# Patient Record
Sex: Female | Born: 1950
Health system: Southern US, Community
[De-identification: ages and names within clinical notes are randomized; demographics above are authoritative.]

## PROBLEM LIST (undated history)

## (undated) DIAGNOSIS — H3320 Serous retinal detachment, unspecified eye: Secondary | ICD-10-CM

## (undated) HISTORY — DX: Serous retinal detachment, unspecified eye: H33.20

---
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
## 1988-03-25 HISTORY — PX: CHOLECYSTECTOMY: SHX55

## 1994-03-25 HISTORY — PX: RETINAL DETACHMENT SURGERY: SHX105

## 2013-07-22 LAB — TSH: TSH: 1.59 u[IU]/mL (ref 0.41–5.90)

## 2013-07-22 LAB — CBC AND DIFFERENTIAL
Hemoglobin: 14.7 g/dL (ref 12.0–16.0)
Platelets: 258 10*3/uL (ref 150–399)
WBC: 6.2 10*3/mL

## 2013-07-22 LAB — HEPATIC FUNCTION PANEL
ALK PHOS: 52 U/L (ref 25–125)
ALT: 20 U/L (ref 7–35)
AST: 20 U/L (ref 13–35)

## 2013-07-22 LAB — LIPID PANEL
CHOLESTEROL: 217 mg/dL — AB (ref 0–200)
HDL: 80 mg/dL — AB (ref 35–70)
LDL Cholesterol: 117 mg/dL
Triglycerides: 111 mg/dL (ref 40–160)

## 2013-07-22 LAB — BASIC METABOLIC PANEL
CREATININE: 0.8 mg/dL (ref 0.5–1.1)
Glucose: 86 mg/dL
Potassium: 4.2 mmol/L (ref 3.4–5.3)
Sodium: 138 mmol/L (ref 137–147)

## 2014-05-12 ENCOUNTER — Ambulatory Visit (INDEPENDENT_AMBULATORY_CARE_PROVIDER_SITE_OTHER): Payer: BLUE CROSS/BLUE SHIELD | Admitting: Family Medicine

## 2014-05-12 ENCOUNTER — Encounter: Payer: Self-pay | Admitting: Family Medicine

## 2014-05-12 VITALS — BP 141/98 | HR 96 | Ht 64.0 in | Wt 158.0 lb

## 2014-05-12 DIAGNOSIS — I10 Essential (primary) hypertension: Secondary | ICD-10-CM

## 2014-05-12 DIAGNOSIS — Z1211 Encounter for screening for malignant neoplasm of colon: Secondary | ICD-10-CM | POA: Diagnosis not present

## 2014-05-12 NOTE — Progress Notes (Signed)
Subjective:    Patient ID: Amanda Moyer, female    DOB: [birth year], 64 y.o.   MRN: [id number]  HPI  64 year old female with a history of hypertension been chronic nasal allergic rhinitis comes in today to establish care. She was previously being seen at [facility]. Her physician of 20 years died suddenly. She has been on valsartan HCT for quite some time and has done well with it. She denies any side effects. No chest pain or short of breath. Normally but pressure is very well controlled. She does have allergies to cats and does have animals in her home so does take Sudafed on a regular basis. She typically uses Claritin as well. And sometimes Flonase but not consistently. She also reports a history of a goiter but has had normal thyroid levels were quite some time. She says she's due for repeat blood work in May or June of this year. She will get her records transferred. Next  She reports that her Pap smear and mammogram are all up-to-date.   Review of Systems  Constitutional: Negative for fever, diaphoresis and unexpected weight change.  HENT: Negative for hearing loss, rhinorrhea and tinnitus.   Eyes: Negative for visual disturbance.  Respiratory: Negative for cough and wheezing.   Cardiovascular: Negative for chest pain and palpitations.  Gastrointestinal: Negative for nausea, vomiting, diarrhea and blood in stool.  Genitourinary: Negative for vaginal bleeding, vaginal discharge and difficulty urinating.  Musculoskeletal: Negative for myalgias and arthralgias.  Skin: Negative for rash.  Neurological: Negative for headaches.  Hematological: Negative for adenopathy. Does not bruise/bleed easily.  Psychiatric/Behavioral: Positive for sleep disturbance. Negative for dysphoric mood. The patient is not nervous/anxious.    BP 141/98 mmHg  Pulse 96  Ht  (1.626 m)  Wt 158 lb (71.668 kg)  BMI 27.11 kg/m2    No Known Allergies  Past Medical History  Diagnosis Date  . Retinal  detachment     both eyes     Past Surgical History  Procedure Laterality Date  . Cholecystectomy  1990  . Tonsillectomy  1960  . Arm fracture  1966  . Retinal detachment surgery Left 1996    History   Social History  . Marital Status: Married    Spouse Name: N/A  . Number of Children: N/A  . Years of Education: N/A   Occupational History  . Not on file.   Social History Main Topics  . Smoking status: Never Smoker   . Smokeless tobacco: Not on file  . Alcohol Use: 1.2 oz/week    2 Glasses of wine per week  . Drug Use: No  . Sexual Activity:    Partners: Male   Other Topics Concern  . Not on file   Social History Narrative   Exercises regularly by walking for 40-6- minutes 3 times a week. 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 She is married to [place] and has 2 adult children.       Family History  Problem Relation Age of Onset  . Prostate cancer Paternal Grandfather     prostate  . Depression Maternal Grandmother   . Hypertension Mother   . Hypertension Sister     Outpatient Encounter Prescriptions as of 05/12/2014  Medication Sig  . valsartan-hydrochlorothiazide (DIOVAN-HCT) 80-12.5 MG per tablet Take 1 tablet by mouth daily.          Objective:   Physical Exam  Constitutional: She is oriented to person, place, and time. She appears  well-developed and well-nourished.  HENT:  Head: Normocephalic and atraumatic.  Right Ear: External ear normal.  Left Ear: External ear normal.  Eyes: Conjunctivae are normal. Pupils are equal, round, and reactive to light.  Neck: Neck supple. No thyromegaly present.  Cardiovascular: Normal rate, regular rhythm and normal heart sounds.   No carotid bruits.   Pulmonary/Chest: Effort normal and breath sounds normal.  Musculoskeletal: She exhibits no edema.  NO LE edema.   Lymphadenopathy:    She has no cervical adenopathy.  Neurological: She is alert and oriented to person, place, and time.  Skin: Skin is warm  and dry.  Psychiatric: She has a normal mood and affect. Her behavior is normal.          Assessment & Plan:  HTN - not maximally controlled today. She will follow at home and call if still elevated.  F/U in 3 months for CPE and can recheck at that time.     Due for screening colonosocpy. Will refer to GI. Prefers a female provider.

## 2014-05-17 ENCOUNTER — Encounter: Payer: Self-pay | Admitting: Family Medicine

## 2014-05-17 DIAGNOSIS — M8580 Other specified disorders of bone density and structure, unspecified site: Secondary | ICD-10-CM | POA: Insufficient documentation

## 2014-05-18 ENCOUNTER — Encounter: Payer: Self-pay | Admitting: Family Medicine

## 2014-07-06 ENCOUNTER — Encounter: Payer: Self-pay | Admitting: Family Medicine

## 2014-07-06 DIAGNOSIS — J309 Allergic rhinitis, unspecified: Secondary | ICD-10-CM | POA: Insufficient documentation

## 2014-07-06 DIAGNOSIS — K219 Gastro-esophageal reflux disease without esophagitis: Secondary | ICD-10-CM | POA: Insufficient documentation

## 2014-07-06 DIAGNOSIS — J45909 Unspecified asthma, uncomplicated: Secondary | ICD-10-CM | POA: Insufficient documentation

## 2014-07-14 ENCOUNTER — Encounter: Payer: Self-pay | Admitting: Family Medicine

## 2014-09-12 ENCOUNTER — Ambulatory Visit (INDEPENDENT_AMBULATORY_CARE_PROVIDER_SITE_OTHER): Payer: BLUE CROSS/BLUE SHIELD | Admitting: Family Medicine

## 2014-09-12 ENCOUNTER — Encounter: Payer: Self-pay | Admitting: Family Medicine

## 2014-09-12 VITALS — BP 135/86 | HR 83 | Temp 98.4°F | Wt 147.0 lb

## 2014-09-12 DIAGNOSIS — I10 Essential (primary) hypertension: Secondary | ICD-10-CM

## 2014-09-12 DIAGNOSIS — R002 Palpitations: Secondary | ICD-10-CM

## 2014-09-12 DIAGNOSIS — F5231 Female orgasmic disorder: Secondary | ICD-10-CM

## 2014-09-12 DIAGNOSIS — Z1231 Encounter for screening mammogram for malignant neoplasm of breast: Secondary | ICD-10-CM | POA: Diagnosis not present

## 2014-09-12 DIAGNOSIS — R37 Sexual dysfunction, unspecified: Secondary | ICD-10-CM

## 2014-09-12 DIAGNOSIS — E049 Nontoxic goiter, unspecified: Secondary | ICD-10-CM | POA: Insufficient documentation

## 2014-09-12 DIAGNOSIS — IMO0002 Reserved for concepts with insufficient information to code with codable children: Secondary | ICD-10-CM

## 2014-09-12 NOTE — Progress Notes (Signed)
   Subjective:    Patient ID: Amanda Moyer, female    DOB: 1950/11/21, 64 y.o.   MRN: [id number]  HPI Hypertension- Pt denies chest pain, SOB, dizziness, or heart palpitations.  Taking meds as directed w/o problems.  Denies medication side effects.   Has lost 11 lbs.  She is doing fantastic.    Pain with orgasm. Will feel a little crampy and is breif.  Not during sex or penetration. Says she is postmenopuasal and says she wanted to know if OK.  No abnormal bleeding, etc. No vaginal dryness  occ feels breath "catch" in her chest or her heart skip.  Usually just a second.  Happens rarely. Not repetative or clusters. No known triggers.  No chest pain or SOB.   Review of Systems     Objective:   Physical Exam  Constitutional: She is oriented to person, place, and time. She appears well-developed and well-nourished.  HENT:  Head: Normocephalic and atraumatic.  Cardiovascular: Normal rate, regular rhythm and normal heart sounds.   Pulmonary/Chest: Effort normal and breath sounds normal.  Neurological: She is alert and oriented to person, place, and time.  Skin: Skin is warm and dry.  Psychiatric: She has a normal mood and affect. Her behavior is normal.          Assessment & Plan:  HTN - well controlled today but borderline.  Discussed options. She really wants to lose about 10 more lbs so can give a few more months to get BP even lower.  Don't want it to just be borderline.  Or could increase or adjust med today. She opted to work on losing next 10 lbs and going from there.    Pain with orgasm. Since not during intercourse may just be cramping of the uterus with orgasm.    Palpitations- rare, non distressing or disruptive. If happening more frequently to lasting longer then let me know. Likely beningn.     Due for screening colonoscopy and mammogram. Says she will schedule.Marland Kitchen

## 2014-12-14 ENCOUNTER — Other Ambulatory Visit: Payer: Self-pay | Admitting: Family Medicine

## 2014-12-22 LAB — ALBUMIN: ALBUMIN - PEBFLD: 5

## 2014-12-22 LAB — HEPATIC FUNCTION PANEL
ALT: 27 U/L (ref 7–35)
AST: 24 U/L (ref 13–35)
Alkaline Phosphatase: 58 U/L (ref 25–125)

## 2014-12-22 LAB — BASIC METABOLIC PANEL
Creatinine: 0.9 mg/dL (ref 0.5–1.1)
POTASSIUM: 3.9 mmol/L (ref 3.4–5.3)

## 2014-12-22 LAB — TISSUE TRANSGLUTAMINASE, IGA: Tissue Transglutaminase Ab, IgA: <2

## 2014-12-22 LAB — CBC AND DIFFERENTIAL
HEMOGLOBIN: 15.7 g/dL (ref 12.0–16.0)
PLATELETS: 320 10*3/uL (ref 150–399)
WBC: 7.1 10^3/mL

## 2014-12-22 LAB — COMPLETE METABOLIC PANEL WITH GFR
Calcium, Ser: 10.1
Chloride: 90 mmol/L

## 2014-12-22 LAB — TSH: TSH: 1.42 u[IU]/mL (ref 0.41–5.90)

## 2014-12-27 ENCOUNTER — Encounter: Payer: Self-pay | Admitting: Family Medicine

## 2014-12-27 ENCOUNTER — Ambulatory Visit (INDEPENDENT_AMBULATORY_CARE_PROVIDER_SITE_OTHER): Payer: BLUE CROSS/BLUE SHIELD | Admitting: Family Medicine

## 2014-12-27 VITALS — BP 138/99 | HR 76 | Wt 143.0 lb

## 2014-12-27 DIAGNOSIS — Z0000 Encounter for general adult medical examination without abnormal findings: Secondary | ICD-10-CM | POA: Diagnosis not present

## 2014-12-27 DIAGNOSIS — Z23 Encounter for immunization: Secondary | ICD-10-CM | POA: Diagnosis not present

## 2014-12-27 DIAGNOSIS — E049 Nontoxic goiter, unspecified: Secondary | ICD-10-CM | POA: Diagnosis not present

## 2014-12-27 DIAGNOSIS — M8580 Other specified disorders of bone density and structure, unspecified site: Secondary | ICD-10-CM | POA: Diagnosis not present

## 2014-12-27 DIAGNOSIS — I10 Essential (primary) hypertension: Secondary | ICD-10-CM | POA: Diagnosis not present

## 2014-12-27 DIAGNOSIS — Z1159 Encounter for screening for other viral diseases: Secondary | ICD-10-CM

## 2014-12-27 DIAGNOSIS — Z114 Encounter for screening for human immunodeficiency virus [HIV]: Secondary | ICD-10-CM

## 2014-12-27 MED ORDER — VALSARTAN-HYDROCHLOROTHIAZIDE 80-12.5 MG PO TABS: 1.0000 | ORAL_TABLET | Freq: Every day | ORAL | Status: DC

## 2014-12-27 NOTE — Patient Instructions (Signed)
Keep up a regular exercise program and make sure you are eating a healthy diet Try to eat 4 servings of dairy a day, or if you are lactose intolerant take a calcium with vitamin D daily.  Your vaccines are up to date.   

## 2014-12-27 NOTE — Progress Notes (Signed)
  Subjective:     Amanda Moyer is a 64 y.o. female and is here for a comprehensive physical exam. The patient reports no problems.  Social History   Social History  . Marital Status: Married    Spouse Name: Amanda Moyer  . Number of Children: 2  . Years of Education: BS Sociolo   Occupational History  . Homemaker    Social History Main Topics  . Smoking status: Never Smoker   . Smokeless tobacco: Not on file  . Alcohol Use: 1.2 oz/week    2 Glasses of wine per week  . Drug Use: No  . Sexual Activity:    Partners: Male   Other Topics Concern  . Not on file   Social History Narrative   Exercises regularly by walking for 40-6- minutes 3 times a week. 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 She is married to Amanda Moyer and has 2 adult children.      Health Maintenance  Topic Date Due  . Hepatitis C Screening  06/01/1950  . HIV Screening  08/15/1967  . COLONOSCOPY  08/14/2012  . ZOSTAVAX  08/14/2012  . INFLUENZA VACCINE  10/24/2014  . MAMMOGRAM  09/04/2015  . DEXA SCAN  09/04/2015  . PAP SMEAR  08/28/2016  . TETANUS/TDAP  06/01/2024    The following portions of the patient's history were reviewed and updated as appropriate: allergies, current medications, past family history, past medical history, past social history, past surgical history and problem list.  Review of Systems Pertinent items noted in HPI and remainder of comprehensive ROS otherwise negative.   Objective:    BP 138/99 mmHg  Pulse 76  Wt 143 lb (64.864 kg) General appearance: alert, cooperative and appears stated age Head: Normocephalic, without obvious abnormality, atraumatic Eyes: conj clera, EOMI, PEERLA Ears: normal TM's and external ear canals both ears Nose: Nares normal. Septum midline. Mucosa normal. No drainage or sinus tenderness. Throat: lips, mucosa, and tongue normal; teeth and gums normal Neck: no adenopathy, no carotid bruit, no JVD, supple, symmetrical, trachea midline and thyroid  not enlarged, symmetric, no tenderness/mass/nodules Back: symmetric, no curvature. ROM normal. No CVA tenderness. Lungs: clear to auscultation bilaterally Breasts: normal appearance, no masses or tenderness Heart: regular rate and rhythm, S1, S2 normal, no murmur, click, rub or gallop Abdomen: soft, non-tender; bowel sounds normal; no masses,  no organomegaly Extremities: extremities normal, atraumatic, no cyanosis or edema Pulses: 2+ and symmetric Skin: Skin color, texture, turgor normal. No rashes or lesions Lymph nodes: Cervical, supraclavicular, and axillary nodes normal. Neurologic: Alert and oriented X 3, normal strength and tone. Normal symmetric reflexes. Normal coordination and gait    Assessment:    Healthy female exam.      Plan:     See After Visit Summary for Counseling Recommendations   Keep up a regular exercise program and make sure you are eating a healthy diet Try to eat 4 servings of dairy a day, or if you are lactose intolerant take a calcium with vitamin D daily.  Your vaccines are up to date.  Scheduled for colonoscopy.  She plans to schedule her mammogram later this fall.   Hypertension- Pt denies chest pain, SOB, dizziness, or heart palpitations.  Taking meds as directed w/o problems.  Denies medication side effects.   Blood pressures well controlled continue current regimen. Follow-up in 6 months.

## 2015-01-03 ENCOUNTER — Encounter: Payer: Self-pay | Admitting: Family Medicine

## 2015-01-18 LAB — HM COLONOSCOPY

## 2015-01-31 LAB — HM MAMMOGRAPHY

## 2015-02-23 ENCOUNTER — Encounter: Payer: Self-pay | Admitting: Family Medicine

## 2015-03-17 ENCOUNTER — Other Ambulatory Visit: Payer: Self-pay | Admitting: Family Medicine

## 2015-06-27 ENCOUNTER — Ambulatory Visit: Payer: BLUE CROSS/BLUE SHIELD | Admitting: Family Medicine

## 2015-07-18 ENCOUNTER — Encounter: Payer: Self-pay | Admitting: Family Medicine

## 2015-07-18 ENCOUNTER — Ambulatory Visit (INDEPENDENT_AMBULATORY_CARE_PROVIDER_SITE_OTHER): Payer: BLUE CROSS/BLUE SHIELD | Admitting: Family Medicine

## 2015-07-18 VITALS — BP 134/86 | HR 80 | Wt 153.0 lb

## 2015-07-18 DIAGNOSIS — I10 Essential (primary) hypertension: Secondary | ICD-10-CM

## 2015-07-18 DIAGNOSIS — J4520 Mild intermittent asthma, uncomplicated: Secondary | ICD-10-CM

## 2015-07-18 DIAGNOSIS — E049 Nontoxic goiter, unspecified: Secondary | ICD-10-CM

## 2015-07-18 DIAGNOSIS — M8580 Other specified disorders of bone density and structure, unspecified site: Secondary | ICD-10-CM | POA: Diagnosis not present

## 2015-07-18 DIAGNOSIS — Z1159 Encounter for screening for other viral diseases: Secondary | ICD-10-CM

## 2015-07-18 DIAGNOSIS — Z114 Encounter for screening for human immunodeficiency virus [HIV]: Secondary | ICD-10-CM

## 2015-07-18 MED ORDER — ALBUTEROL SULFATE HFA 108 (90 BASE) MCG/ACT IN AERS: 2.0000 | INHALATION_SPRAY | Freq: Four times a day (QID) | RESPIRATORY_TRACT | Status: DC | PRN

## 2015-07-18 MED ORDER — VALSARTAN-HYDROCHLOROTHIAZIDE 80-12.5 MG PO TABS: 1.0000 | ORAL_TABLET | Freq: Every day | ORAL | Status: DC

## 2015-07-18 NOTE — Addendum Note (Signed)
Addended by: Deno EtienneBARKLEY, Nadeen Shipman L on: 07/18/2015 11:32 AM   Modules accepted: Orders

## 2015-07-18 NOTE — Progress Notes (Signed)
Subjective:    Patient ID: Amanda Moyer, female    DOB: 21-Apr-1950, 65 y.o.   MRN: [id number]  HPI Hypertension- Pt denies chest pain, SOB, dizziness, or heart palpitations.  Taking meds as directed w/o problems.  Denies medication side effects.    Asthma - [facility]. She wants to be willing to take over her albuterol inhaler prescription.  She only uses it about 2 weeks out of the year.  She needs a new rx. She has had to use it a couple of times this week. But normally uses it less than once a month. No exacerbations or ED visits for asthma in the last year.  Osteopenia-she would like to have a vitamin D level checked. Asked DEXA scan was June 2015.  Review of Systems  BP 134/86 mmHg  Pulse 80  Wt 153 lb (69.4 kg)  SpO2 98%    Allergies  Allergen Reactions  . Irbesartan-Hydrochlorothiazide     Other reaction(s): Other (See Comments) Elevated blood pressure.  Marland Kitchen. Percodan [Oxycodone-Aspirin] Nausea Only    Past Medical History  Diagnosis Date  . Retinal detachment     both eyes     Past Surgical History  Procedure Laterality Date  . Cholecystectomy  1990  . Tonsillectomy  1960  . Arm fracture  1966    no hardware  . Retinal detachment surgery Left 1996    Social History   Social History  . Marital Status: Married    Spouse Name: Benita GutterRussel  . Number of Children: 2  . Years of Education: BS Sociolo   Occupational History  . Homemaker    Social History Main Topics  . Smoking status: Never Smoker   . Smokeless tobacco: Not on file  . Alcohol Use: 1.2 oz/week    2 Glasses of wine per week  . Drug Use: No  . Sexual Activity:    Partners: Male   Other Topics Concern  . Not on file   Social History Narrative   Exercises regularly by walking for 40-6- minutes 3 times a week. 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 She is married to [place] and has 2 adult children.       Family History   Problem Relation Age of Onset  . Prostate cancer Paternal Grandfather   . Depression Maternal Grandmother   . Hypertension Mother   . Hypertension Sister   . Alzheimer's disease Mother   . Parkinson's disease Mother     Outpatient Encounter Prescriptions as of 07/18/2015  Medication Sig  . albuterol (PROAIR HFA) 108 (90 Base) MCG/ACT inhaler Inhale 2 puffs into the lungs every 6 (six) hours as needed. 2 puffs 4 times a day as needed for shortness of breath  . fluticasone (FLONASE) 50 MCG/ACT nasal spray Place 1 spray into both nostrils 2 (two) times daily.  Marland Kitchen. linaclotide (LINZESS) 290 MCG CAPS capsule Take 1 capsule by mouth as needed. 1 capsule by mouth as needed  . loratadine (CLARITIN) 10 MG tablet Take 10 mg by mouth daily.  . valsartan-hydrochlorothiazide (DIOVAN-HCT) 80-12.5 MG tablet Take 1 tablet by mouth daily.  . [DISCONTINUED] albuterol (PROAIR HFA) 108 (90 Base) MCG/ACT inhaler Take 2 puffs by mouth 4 (four) times daily as needed. 2 puffs 4 times a day as needed for shortness of breath  . [DISCONTINUED] valsartan-hydrochlorothiazide (DIOVAN-HCT) 80-12.5 MG tablet Take 1 tablet by mouth daily.   No facility-administered encounter  medications on file as of 07/18/2015.          Objective:   Physical Exam  Constitutional: She is oriented to person, place, and time. She appears well-developed and well-nourished.  HENT:  Head: Normocephalic and atraumatic.  Cardiovascular: Normal rate, regular rhythm and normal heart sounds.   Pulmonary/Chest: Effort normal and breath sounds normal.  Neurological: She is alert and oriented to person, place, and time.  Skin: Skin is warm and dry.  Psychiatric: She has a normal mood and affect. Her behavior is normal.       Assessment & Plan:  HTN - Well controlled. Continue current regimen. Follow up in 42mo.    Asthma-mild intermittent-just uses albuterol as needed. New prescription sent to the pharmacy.  Osteopenia-we'll check  vitamin D level. Is on current guidelines repeat bone density in 10 years. She is low risk.  Shingles vaccine discussed.

## 2015-07-19 LAB — VITAMIN D 25 HYDROXY (VIT D DEFICIENCY, FRACTURES): VIT D 25 HYDROXY: 29 ng/mL — AB (ref 30–100)

## 2015-07-19 LAB — LIPID PANEL
CHOL/HDL RATIO: 2.8 ratio (ref <=5.0)
Cholesterol: 219 mg/dL — ABNORMAL HIGH (ref 125–200)
HDL: 78 mg/dL (ref >=46)
LDL CALC: 112 mg/dL (ref <130)
Triglycerides: 147 mg/dL (ref <150)
VLDL: 29 mg/dL (ref <30)

## 2015-07-19 LAB — COMPLETE METABOLIC PANEL WITH GFR
ALBUMIN: 4.5 g/dL (ref 3.6–5.1)
ALK PHOS: 47 U/L (ref 33–130)
ALT: 16 U/L (ref 6–29)
AST: 17 U/L (ref 10–35)
BUN: 11 mg/dL (ref 7–25)
CO2: 28 mmol/L (ref 20–31)
Calcium: 9.7 mg/dL (ref 8.6–10.4)
Chloride: 99 mmol/L (ref 98–110)
Creat: 0.88 mg/dL (ref 0.50–0.99)
GFR, EST NON AFRICAN AMERICAN: 71 mL/min (ref >=60)
GFR, Est African American: 81 mL/min (ref >=60)
GLUCOSE: 78 mg/dL (ref 65–99)
Potassium: 3.8 mmol/L (ref 3.5–5.3)
SODIUM: 138 mmol/L (ref 135–146)
TOTAL PROTEIN: 7.4 g/dL (ref 6.1–8.1)
Total Bilirubin: 0.7 mg/dL (ref 0.2–1.2)

## 2015-07-19 LAB — HEPATITIS C ANTIBODY: HCV AB: NEGATIVE

## 2015-07-19 LAB — TSH: TSH: 2.34 mIU/L

## 2015-07-19 LAB — HIV ANTIBODY (ROUTINE TESTING W REFLEX): HIV: NONREACTIVE

## 2016-01-16 ENCOUNTER — Ambulatory Visit: Payer: BLUE CROSS/BLUE SHIELD | Admitting: Family Medicine

## 2016-02-01 LAB — HM MAMMOGRAPHY

## 2016-02-05 ENCOUNTER — Encounter: Payer: Self-pay | Admitting: Family Medicine

## 2016-02-27 LAB — HM MAMMOGRAPHY

## 2016-03-22 ENCOUNTER — Encounter: Payer: Self-pay | Admitting: Family Medicine

## 2016-05-02 ENCOUNTER — Other Ambulatory Visit: Payer: Self-pay | Admitting: Family Medicine

## 2016-05-07 ENCOUNTER — Other Ambulatory Visit: Payer: Self-pay | Admitting: Family Medicine

## 2016-06-03 ENCOUNTER — Telehealth: Payer: Self-pay | Admitting: Family Medicine

## 2016-06-03 NOTE — Telephone Encounter (Signed)
I called patient and left a message that she is due for a f/u on BP and labs with Dr.Metheney

## 2016-06-07 ENCOUNTER — Ambulatory Visit (INDEPENDENT_AMBULATORY_CARE_PROVIDER_SITE_OTHER): Payer: BLUE CROSS/BLUE SHIELD | Admitting: Family Medicine

## 2016-06-07 ENCOUNTER — Encounter: Payer: Self-pay | Admitting: Family Medicine

## 2016-06-07 VITALS — BP 144/92 | HR 102 | Temp 97.9°F | Ht 64.0 in | Wt 160.0 lb

## 2016-06-07 DIAGNOSIS — J208 Acute bronchitis due to other specified organisms: Secondary | ICD-10-CM | POA: Diagnosis not present

## 2016-06-07 DIAGNOSIS — J019 Acute sinusitis, unspecified: Secondary | ICD-10-CM | POA: Diagnosis not present

## 2016-06-07 MED ORDER — AZITHROMYCIN 250 MG PO TABS: ORAL_TABLET | ORAL | 0 refills | Status: AC

## 2016-06-07 NOTE — Progress Notes (Signed)
   Subjective:    Patient ID: Georjean ModeSuzanne Vanwagner, female    DOB: 08/06/1950, 66 y.o.   MRN: [id number]  HPI  66 yo female comes in today with persistent sinus and chest symptoms. She was seen at [facility] about 7 days ago and diagnosed with acute bronchitis. She was placed on Augmentin and prednisone. She says she really just hasn't felt tremendously better. She still has a productive cough. No chills or sweats but does feel like she's been having some low-grade fevers at night. She still feels like she has a lot of sinus congestion and drainage but no facial pain or pressure. She's mostly been using Mucinex in addition to the prescribed antibiotic and prednisone. She has not used her inhaler though she has heard some intermittent wheezing.   Review of Systems     Objective:   Physical Exam  Constitutional: She is oriented to person, place, and time. She appears well-developed and well-nourished.  HENT:  Head: Normocephalic and atraumatic.  Right Ear: External ear normal.  Left Ear: External ear normal.  Nose: Nose normal.  Mouth/Throat: Oropharynx is clear and moist.  TMs and canals are clear.   Eyes: Conjunctivae and EOM are normal. Pupils are equal, round, and reactive to light.  Neck: Neck supple. No thyromegaly present.  Cardiovascular: Normal rate, regular rhythm and normal heart sounds.   Pulmonary/Chest: Effort normal and breath sounds normal. She has no wheezes.  Lymphadenopathy:    She has no cervical adenopathy.  Neurological: She is alert and oriented to person, place, and time.  Skin: Skin is warm and dry.  Psychiatric: She has a normal mood and affect.        Assessment & Plan:  Acute sinusitis/bronchitis - complete the augment and prednisone.  Will give zpack to fill if not better while out of town. OK To continue Mucinex and if she has spring allergies she may want to go ahead and start her Zyrtec that she normally takes in the spring. Use inhaler PRN.

## 2016-08-06 ENCOUNTER — Encounter: Payer: BLUE CROSS/BLUE SHIELD | Admitting: Family Medicine

## 2016-08-09 ENCOUNTER — Other Ambulatory Visit: Payer: Self-pay | Admitting: Family Medicine

## 2016-08-28 ENCOUNTER — Encounter: Payer: BLUE CROSS/BLUE SHIELD | Admitting: Family Medicine

## 2016-09-03 ENCOUNTER — Encounter: Payer: BLUE CROSS/BLUE SHIELD | Admitting: Family Medicine

## 2016-09-18 ENCOUNTER — Other Ambulatory Visit (HOSPITAL_COMMUNITY)
Admission: RE | Admit: 2016-09-18 | Discharge: 2016-09-18 | Disposition: A | Discharge status: Home / Self Care | Payer: BLUE CROSS/BLUE SHIELD | Source: Ambulatory Visit | Attending: Family Medicine | Admitting: Family Medicine

## 2016-09-18 ENCOUNTER — Ambulatory Visit (INDEPENDENT_AMBULATORY_CARE_PROVIDER_SITE_OTHER): Payer: BLUE CROSS/BLUE SHIELD | Admitting: Family Medicine

## 2016-09-18 ENCOUNTER — Encounter: Payer: Self-pay | Admitting: Family Medicine

## 2016-09-18 VITALS — BP 148/99 | HR 81 | Ht 64.0 in | Wt 156.0 lb

## 2016-09-18 DIAGNOSIS — Z0000 Encounter for general adult medical examination without abnormal findings: Secondary | ICD-10-CM

## 2016-09-18 DIAGNOSIS — E559 Vitamin D deficiency, unspecified: Secondary | ICD-10-CM | POA: Diagnosis not present

## 2016-09-18 DIAGNOSIS — E049 Nontoxic goiter, unspecified: Secondary | ICD-10-CM

## 2016-09-18 DIAGNOSIS — I10 Essential (primary) hypertension: Secondary | ICD-10-CM | POA: Diagnosis not present

## 2016-09-18 DIAGNOSIS — Z01419 Encounter for gynecological examination (general) (routine) without abnormal findings: Secondary | ICD-10-CM

## 2016-09-18 DIAGNOSIS — Z13220 Encounter for screening for lipoid disorders: Secondary | ICD-10-CM

## 2016-09-18 DIAGNOSIS — N841 Polyp of cervix uteri: Secondary | ICD-10-CM

## 2016-09-18 LAB — HM PAP SMEAR: HM PAP: NEGATIVE

## 2016-09-18 MED ORDER — VALSARTAN-HYDROCHLOROTHIAZIDE 160-25 MG PO TABS: 1.0000 | ORAL_TABLET | Freq: Every day | ORAL | 1 refills | Status: DC

## 2016-09-18 NOTE — Progress Notes (Signed)
Subjective:     Amanda Moyer is a 66 y.o. female and is here for a comprehensive physical exam. The patient reports no problems.  Social History   Social History  . Marital status: Married    Spouse name: Benita GutterRussel  . Number of children: 2  . Years of education: BS Sociolo   Occupational History  . Homemaker    Social History Main Topics  . Smoking status: Never Smoker  . Smokeless tobacco: Never Used  . Alcohol use 1.2 oz/week    2 Glasses of wine per week  . Drug use: No  . Sexual activity: Yes    Partners: Male   Other Topics Concern  . Not on file   Social History Narrative   Exercises regularly by walking for 40-6- minutes 3 times a week. 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 She is married to [place] and has 2 adult children.      Health Maintenance  Topic Date Due  . COLONOSCOPY  08/14/2012  . PAP SMEAR  08/28/2016  . INFLUENZA VACCINE  10/23/2016  . MAMMOGRAM  02/26/2018  . DEXA SCAN  09/04/2023  . TETANUS/TDAP  06/01/2024  . Hepatitis C Screening  Completed  . HIV Screening  Completed    The following portions of the patient's history were reviewed and updated as appropriate: allergies, current medications, past family history, past medical history, past social history, past surgical history and problem list.  Review of Systems A comprehensive review of systems was negative.   Objective:    BP (!) 156/97   Pulse 81   Ht 5\' 4"  (1.626 m)   Wt 156 lb (70.8 kg)   BMI 26.78 kg/m  General appearance: alert and cooperative Head: Normocephalic, without obvious abnormality, atraumatic Eyes: conj clear, EOMI, PEERLA Ears: normal TM's and external ear canals both ears Nose: Nares normal. Septum midline. Mucosa normal. No drainage or sinus tenderness. Throat: lips, mucosa, and tongue normal; teeth and gums normal Neck: no adenopathy, no carotid bruit, no JVD, supple, symmetrical, trachea midline and thyroid not enlarged, symmetric, no  tenderness/mass/nodules Back: symmetric, no curvature. ROM normal. No CVA tenderness. Lungs: clear to auscultation bilaterally Breasts: normal appearance, no masses or tenderness Heart: regular rate and rhythm, S1, S2 normal, no murmur, click, rub or gallop Abdomen: soft, non-tender; bowel sounds normal; no masses,  no organomegaly Pelvic: cervix normal in appearance, external genitalia normal, no adnexal masses or tenderness, no cervical motion tenderness, rectovaginal septum normal, uterus normal size, shape, and consistency, vagina normal without discharge and small cervical polyp Extremities: extremities normal, atraumatic, no cyanosis or edema Pulses: 2+ and symmetric Skin: Skin color, texture, turgor normal. No rashes or lesions Lymph nodes: Cervical, supraclavicular, and axillary nodes normal. Neurologic: Alert and oriented X 3, normal strength and tone. Normal symmetric reflexes. Normal coordination and gait    Assessment:    Healthy female exam.      Plan:     See After Visit Summary for Counseling Recommendations   Keep up a regular exercise program and make sure you are eating a healthy diet Try to eat 4 servings of dairy a day, or if you are lactose intolerant take a calcium with vitamin D daily.  Your vaccines are up to date.  Due for labs.   Cervical polyp - Recommend referral to GYN for removal. It is small.  Hyperpretension-uncontrolled today. Will increase Diovan HCTZ. Follow-up in 1-2 weeks for nurse blood pressure check.

## 2016-09-19 LAB — COMPLETE METABOLIC PANEL WITH GFR
ALT: 20 U/L (ref 6–29)
AST: 21 U/L (ref 10–35)
Albumin: 4.7 g/dL (ref 3.6–5.1)
Alkaline Phosphatase: 49 U/L (ref 33–130)
BUN: 11 mg/dL (ref 7–25)
CHLORIDE: 98 mmol/L (ref 98–110)
CO2: 24 mmol/L (ref 20–31)
Calcium: 9.8 mg/dL (ref 8.6–10.4)
Creat: 0.84 mg/dL (ref 0.50–0.99)
GFR, Est African American: 85 mL/min (ref >=60)
GFR, Est Non African American: 74 mL/min (ref >=60)
GLUCOSE: 91 mg/dL (ref 65–99)
POTASSIUM: 3.7 mmol/L (ref 3.5–5.3)
SODIUM: 136 mmol/L (ref 135–146)
Total Bilirubin: 1.1 mg/dL (ref 0.2–1.2)
Total Protein: 7.4 g/dL (ref 6.1–8.1)

## 2016-09-19 LAB — LIPID PANEL W/REFLEX DIRECT LDL
CHOL/HDL RATIO: 2.9 ratio (ref <5.0)
CHOLESTEROL: 219 mg/dL — AB (ref <200)
HDL: 76 mg/dL (ref >50)
LDL-Cholesterol: 121 mg/dL — ABNORMAL HIGH
NON-HDL CHOLESTEROL (CALC): 143 mg/dL — AB (ref <130)
Triglycerides: 112 mg/dL (ref <150)

## 2016-09-19 LAB — TSH: TSH: 1.44 m[IU]/L

## 2016-09-19 LAB — VITAMIN D 25 HYDROXY (VIT D DEFICIENCY, FRACTURES): Vit D, 25-Hydroxy: 23 ng/mL — ABNORMAL LOW (ref 30–100)

## 2016-09-20 LAB — CYTOLOGY - PAP: HPV (WINDOPATH): NOT DETECTED

## 2016-09-20 NOTE — Progress Notes (Signed)
Call patient: Your Pap smear is normal and negative for HPV.

## 2016-10-01 ENCOUNTER — Ambulatory Visit (INDEPENDENT_AMBULATORY_CARE_PROVIDER_SITE_OTHER): Payer: BLUE CROSS/BLUE SHIELD | Admitting: Family Medicine

## 2016-10-01 VITALS — BP 125/87 | HR 76 | Wt 156.0 lb

## 2016-10-01 DIAGNOSIS — I10 Essential (primary) hypertension: Secondary | ICD-10-CM

## 2016-10-01 NOTE — Progress Notes (Signed)
Hypertension-blood pressure looks fantastic today. Continue current regimen. Keep regular scheduled follow-up. As far [facility] The [facility] was placed on not sure why she has not been contacted yet.

## 2016-10-01 NOTE — Progress Notes (Signed)
Pt is here for BP check. She denies any CP, SOB and is taking medication as directed with no problems. Pt advised to f/u for BP in December Also pt inquired about the referral that was placed for her regarding the polyp  .Amanda PacasBarkley, Ciaran Begay [place]

## 2016-10-02 NOTE — Progress Notes (Signed)
I walked pt down to the GYN office to schedule her appt.Amanda PacasBarkley, Amanda Aoun GrinnellLynetta

## 2016-10-03 ENCOUNTER — Encounter: Payer: Self-pay | Admitting: Family Medicine

## 2016-10-07 ENCOUNTER — Encounter: Payer: Self-pay | Admitting: *Deleted

## 2016-10-07 ENCOUNTER — Ambulatory Visit (INDEPENDENT_AMBULATORY_CARE_PROVIDER_SITE_OTHER): Payer: BLUE CROSS/BLUE SHIELD | Admitting: Obstetrics & Gynecology

## 2016-10-07 ENCOUNTER — Encounter: Payer: Self-pay | Admitting: Obstetrics & Gynecology

## 2016-10-07 VITALS — BP 139/78 | HR 82 | Resp 16 | Ht 64.5 in | Wt 157.0 lb

## 2016-10-07 DIAGNOSIS — N841 Polyp of cervix uteri: Secondary | ICD-10-CM

## 2016-10-07 NOTE — Progress Notes (Signed)
History:  66 y.o. G2P2 here today as a referral for removal of cervical polyp. Her primary care provider did a GYN exam with PAP and noted a cervical polyp with a flat base and referred pt for removal. Pt denies bleeding.   She denies other GYN problems.  The following portions of the patient's history were reviewed and updated as appropriate: allergies, current medications, past family history, past medical history, past social history, past surgical history and problem list.  Review of Systems:  Pertinent items are noted in HPI.   Objective:  Physical Exam Blood pressure 139/78, pulse 82, resp. rate 16, height 5' 4.5" (1.638 m), weight 157 lb (71.2 kg).  CONSTITUTIONAL: Well-developed, well-nourished female in no acute distress.  HENT:  Normocephalic, atraumatic EYES: Conjunctivae and EOM are normal. No scleral icterus.  NECK: Normal range of motion SKIN: Skin is warm and dry. No rash noted. Not diaphoretic.No pallor. NEUROLGIC: Alert and oriented to person, place, and time. Normal coordination.  Pelvic: Normal appearing external genitalia; normal appearing vaginal mucosa. Cervical polyp noted. Using the long kelly forceps the polyps was removed. Part of the base could not be grasped and silver nitrate was used to dissolve the remainder of the lesion.  Normal discharge.      Assessment & Plan:  Endocervical polyp- suspect benign  F/u surg path rec f/u in 1 year for annual. Pt would like to come here for her annual GYN exam Total face-to-face time with patient was 15 min.  Greater than 50% was spent in counseling and coordination of care with the patient. We discussed- new pt info.      Virginie Josten L. Harraway-Smith, M.D., Evern CoreFACOG

## 2016-10-14 ENCOUNTER — Telehealth: Payer: Self-pay | Admitting: *Deleted

## 2016-10-14 NOTE — Telephone Encounter (Signed)
-----   Message from Willodean Rosenthalarolyn Harraway-Smith, MD sent at 10/13/2016 10:55 AM EDT ----- Please call pt. Her surg path for polyp was neg.  Thx, clh-S

## 2016-10-25 ENCOUNTER — Encounter: Payer: Self-pay | Admitting: Family Medicine

## 2016-10-29 ENCOUNTER — Ambulatory Visit: Payer: BLUE CROSS/BLUE SHIELD | Admitting: Osteopathic Medicine

## 2016-10-29 DIAGNOSIS — Z0189 Encounter for other specified special examinations: Secondary | ICD-10-CM

## 2016-11-22 ENCOUNTER — Other Ambulatory Visit: Payer: Self-pay | Admitting: Family Medicine

## 2017-01-15 NOTE — Progress Notes (Signed)
   Subjective:    Patient ID: Amanda Moyer, female    DOB: [birth year], 66 y.o.   MRN: [id number]  HPI 66 year old female comes in today complaining of a cough for approximately 10 days that is nonproductive.  But she still starting to get pain in her chest when she coughs.  She has been taking Mucinex and NyQuil and Primatene.  Last night she developed a low-grade temperature of 99.6. She feels she is extremely fatigue and getting some worse.    Review of Systems     Objective:   Physical Exam  Constitutional: She is oriented to person, place, and time. She appears well-developed and well-nourished.  HENT:  Head: Normocephalic and atraumatic.  Right Ear: External ear normal.  Left Ear: External ear normal.  Nose: Nose normal.  Mouth/Throat: Oropharynx is clear and moist.  TMs and canals are clear.   Eyes: Pupils are equal, round, and reactive to light. Conjunctivae and EOM are normal.  Neck: Neck supple. No thyromegaly present.  Cardiovascular: Normal rate, regular rhythm and normal heart sounds.   Pulmonary/Chest: Effort normal and breath sounds normal. She has no wheezes.  Lymphadenopathy:    She has no cervical adenopathy.  Neurological: She is alert and oriented to person, place, and time.  Skin: Skin is warm and dry.  Psychiatric: She has a normal mood and affect.          Assessment & Plan:  Acute bronchitis -gave reassurance.  Call if not better in about 5 days.  Explained that the cough can actually last for several more weeks but as far as energy levels fever etc. that should all be better within about 5 days.  Continue symptomatic care.  Return if any more severe symptoms or high fever.  Lungs are clear gave reassurance no sign of secondary pneumonia.

## 2017-01-16 ENCOUNTER — Encounter: Payer: Self-pay | Admitting: Family Medicine

## 2017-01-16 ENCOUNTER — Ambulatory Visit (INDEPENDENT_AMBULATORY_CARE_PROVIDER_SITE_OTHER): Payer: BLUE CROSS/BLUE SHIELD | Admitting: Family Medicine

## 2017-01-16 VITALS — BP 130/84 | HR 74 | Temp 98.3°F | Ht 65.0 in | Wt 155.0 lb

## 2017-01-16 DIAGNOSIS — J209 Acute bronchitis, unspecified: Secondary | ICD-10-CM

## 2017-01-16 NOTE — Patient Instructions (Addendum)

## 2017-01-31 ENCOUNTER — Telehealth: Payer: Self-pay | Admitting: *Deleted

## 2017-01-31 MED ORDER — AZITHROMYCIN 250 MG PO TABS: ORAL_TABLET | ORAL | 0 refills | Status: AC

## 2017-01-31 NOTE — Telephone Encounter (Signed)
Patient was told to call back if she was not better in a week for an antibiotic. Patient called and is requesting an antibiotic be sent to her pharmacy

## 2017-01-31 NOTE — Telephone Encounter (Signed)
Patient notified

## 2017-01-31 NOTE — Telephone Encounter (Signed)
Prescription sent for azithromycin. If she is not better after 5 days then please come back in so that we can recheck her lungs.

## 2017-03-26 ENCOUNTER — Other Ambulatory Visit: Payer: Self-pay | Admitting: Family Medicine

## 2017-05-16 ENCOUNTER — Telehealth: Payer: Self-pay

## 2017-05-16 MED ORDER — OSELTAMIVIR PHOSPHATE 75 MG PO CAPS: 75.0000 mg | ORAL_CAPSULE | Freq: Every day | ORAL | 0 refills | Status: DC

## 2017-05-16 NOTE — Telephone Encounter (Signed)
Rosalita ChessmanSuzanne called and states grandson tested positive for the flu. He has been staying with her. She reports no symptoms.    CMP Latest Ref Rng & Units 09/18/2016 07/18/2015 12/22/2014  Glucose 65 - 99 mg/dL 91 78 -  BUN 7 - 25 mg/dL 11 11 -  Creatinine 1.610.50 - 0.99 mg/dL 0.960.84 0.450.88 0.9  Sodium 409135 - 146 mmol/L 136 138 -  Potassium 3.5 - 5.3 mmol/L 3.7 3.8 3.9  Chloride 98 - 110 mmol/L 98 99 90  CO2 20 - 31 mmol/L 24 28 -  Calcium 8.6 - 10.4 mg/dL 9.8 9.7 81.110.1  Total Protein 6.1 - 8.1 g/dL 7.4 7.4 -  Total Bilirubin 0.2 - 1.2 mg/dL 1.1 0.7 -  Alkaline Phos 33 - 130 U/L 49 47 58  AST 10 - 35 U/L 21 17 24   ALT 6 - 29 U/L 20 16 27

## 2017-05-16 NOTE — Telephone Encounter (Signed)
New rx sent for Tamiflu.

## 2017-05-19 NOTE — Telephone Encounter (Signed)
Left VM for Pt to make sure she got Rx, and to get an update on how she's feeling. Callback info provided.

## 2017-09-19 ENCOUNTER — Other Ambulatory Visit: Payer: Self-pay | Admitting: Family Medicine

## 2017-10-14 ENCOUNTER — Other Ambulatory Visit: Payer: Self-pay | Admitting: Family Medicine

## 2017-10-18 ENCOUNTER — Other Ambulatory Visit: Payer: Self-pay | Admitting: Family Medicine

## 2017-11-03 ENCOUNTER — Other Ambulatory Visit: Payer: Self-pay | Admitting: Family Medicine

## 2017-11-13 DIAGNOSIS — J0110 Acute frontal sinusitis, unspecified: Secondary | ICD-10-CM | POA: Diagnosis not present

## 2017-11-26 ENCOUNTER — Other Ambulatory Visit: Payer: Self-pay | Admitting: Family Medicine

## 2017-12-11 ENCOUNTER — Ambulatory Visit (INDEPENDENT_AMBULATORY_CARE_PROVIDER_SITE_OTHER): Payer: Medicare Other | Admitting: Family Medicine

## 2017-12-11 ENCOUNTER — Encounter: Payer: Self-pay | Admitting: Family Medicine

## 2017-12-11 VITALS — BP 130/84 | HR 85 | Ht 65.0 in | Wt 155.0 lb

## 2017-12-11 DIAGNOSIS — I10 Essential (primary) hypertension: Secondary | ICD-10-CM | POA: Diagnosis not present

## 2017-12-11 DIAGNOSIS — Z1231 Encounter for screening mammogram for malignant neoplasm of breast: Secondary | ICD-10-CM

## 2017-12-11 DIAGNOSIS — Z23 Encounter for immunization: Secondary | ICD-10-CM | POA: Diagnosis not present

## 2017-12-11 DIAGNOSIS — Z0000 Encounter for general adult medical examination without abnormal findings: Secondary | ICD-10-CM | POA: Diagnosis not present

## 2017-12-11 LAB — LIPID PANEL
CHOL/HDL RATIO: 2.8 (calc) (ref <5.0)
CHOLESTEROL: 212 mg/dL — AB (ref <200)
HDL: 75 mg/dL (ref >50)
LDL Cholesterol (Calc): 117 mg/dL (calc) — ABNORMAL HIGH
Non-HDL Cholesterol (Calc): 137 mg/dL (calc) — ABNORMAL HIGH (ref <130)
Triglycerides: 98 mg/dL (ref <150)

## 2017-12-11 LAB — COMPLETE METABOLIC PANEL WITHOUT GFR
AG Ratio: 2 (calc) (ref 1.0–2.5)
ALT: 20 U/L (ref 6–29)
AST: 19 U/L (ref 10–35)
Albumin: 4.7 g/dL (ref 3.6–5.1)
Alkaline phosphatase (APISO): 48 U/L (ref 33–130)
BUN: 12 mg/dL (ref 7–25)
CO2: 28 mmol/L (ref 20–32)
Calcium: 10 mg/dL (ref 8.6–10.4)
Chloride: 99 mmol/L (ref 98–110)
Creat: 0.91 mg/dL (ref 0.50–0.99)
GFR, Est African American: 77 mL/min/1.73m2
GFR, Est Non African American: 66 mL/min/1.73m2
Globulin: 2.3 g/dL (ref 1.9–3.7)
Glucose, Bld: 97 mg/dL (ref 65–99)
Potassium: 3.5 mmol/L (ref 3.5–5.3)
Sodium: 139 mmol/L (ref 135–146)
Total Bilirubin: 0.8 mg/dL (ref 0.2–1.2)
Total Protein: 7 g/dL (ref 6.1–8.1)

## 2017-12-11 LAB — CBC
HEMATOCRIT: 42.4 % (ref 35.0–45.0)
HEMOGLOBIN: 14.5 g/dL (ref 11.7–15.5)
MCH: 30.1 pg (ref 27.0–33.0)
MCHC: 34.2 g/dL (ref 32.0–36.0)
MCV: 88 fL (ref 80.0–100.0)
MPV: 10.7 fL (ref 7.5–12.5)
PLATELETS: 299 10*3/uL (ref 140–400)
RBC: 4.82 10*6/uL (ref 3.80–5.10)
RDW: 12.1 % (ref 11.0–15.0)
WBC: 6.2 10*3/uL (ref 3.8–10.8)

## 2017-12-11 MED ORDER — LINACLOTIDE 290 MCG PO CAPS: 290.0000 ug | ORAL_CAPSULE | Freq: Every day | ORAL | 5 refills | Status: DC

## 2017-12-11 NOTE — Progress Notes (Signed)
Subjective:    Amanda Moyer is a 67 y.o. female who presents for a Welcome to Medicare exam.   Review of Systems Comprehensive ROS is negative.   Cardiac Risk Factors include: advanced age (>82men, >19 women);hypertension      Objective:    Today's Vitals   12/11/17 0910  BP: 130/84  Pulse: 85  Weight: 155 lb (70.3 kg)  Height: 5\' 5"  (1.651 m)  Body mass index is 25.79 kg/m.  Medications Outpatient Encounter Medications as of 12/11/2017  Medication Sig  . albuterol (PROAIR HFA) 108 (90 Base) MCG/ACT inhaler Inhale 2 puffs into the lungs every 6 (six) hours as needed. 2 puffs 4 times a day as needed for shortness of breath  . BIOTIN PO Take by mouth.  . cetirizine (ZYRTEC) 10 MG tablet Take 10 mg by mouth as needed for allergies.  . cholecalciferol (VITAMIN D) 1000 units tablet Take 1,000 Units by mouth daily.  . DiphenhydrAMINE HCl (BENADRYL ALLERGY PO) Take by mouth as needed.  . fluticasone (FLONASE) 50 MCG/ACT nasal spray Place 1 spray into both nostrils 2 (two) times daily.  Marland Kitchen linaclotide (LINZESS) 290 MCG CAPS capsule Take 290 mcg by mouth.  . loratadine (CLARITIN) 10 MG tablet Take 10 mg by mouth daily.  Marland Kitchen MAGNESIUM PO Take by mouth.  . valsartan-hydrochlorothiazide (DIOVAN-HCT) 160-25 MG tablet Take 1 tablet by mouth daily. MUST MAKE APPOINTMENT FOR ANY FURTHER REFILLS  . [DISCONTINUED] oseltamivir (TAMIFLU) 75 MG capsule Take 1 capsule (75 mg total) by mouth daily.   No facility-administered encounter medications on file as of 12/11/2017.      History: Past Medical History:  Diagnosis Date  . Retinal detachment    both eyes    Past Surgical History:  Procedure Laterality Date  . arm fracture  1966   no hardware  . CHOLECYSTECTOMY  1990  . RETINAL DETACHMENT SURGERY Left 1996  . TONSILLECTOMY  1960    Family History  Problem Relation Age of Onset  . Prostate cancer Paternal Grandfather   . Depression Maternal Grandmother   . Hypertension Mother   .  Alzheimer's disease Mother   . Parkinson's disease Mother   . Hypertension Sister    Social History   Occupational History  . Occupation: Homemaker  Tobacco Use  . Smoking status: Never Smoker  . Smokeless tobacco: Never Used  Substance and Sexual Activity  . Alcohol use: Yes    Alcohol/week: 2.0 standard drinks    Types: 2 Glasses of wine per week  . Drug use: No  . Sexual activity: Yes    Partners: Male    Birth control/protection: None    Tobacco Counseling Counseling given: Not Answered   Immunizations and Health Maintenance Immunization History  Administered Date(s) Administered  . Influenza,inj,Quad PF,6+ Mos 11/23/2013, 12/27/2014, 05/02/2016, 12/11/2017  . PPD Test 06/01/2004  . Pneumococcal Conjugate-13 08/04/2017  . Tdap 06/02/2014  . Zoster Recombinat (Shingrix) 08/27/2017   There are no preventive care reminders to display for this patient.  Activities of Daily Living In your present state of health, do you have any difficulty performing the following activities: 12/11/2017  Hearing? N  Comment some ringing  Vision? N  Difficulty concentrating or making decisions? N  Walking or climbing stairs? N  Dressing or bathing? N  Doing errands, shopping? N  Preparing Food and eating ? N  Using the Toilet? N  In the past six months, have you accidently leaked urine? N  Do you have problems with  loss of bowel control? N  Managing your Medications? N  Managing your Finances? N  Housekeeping or managing your Housekeeping? N  Some recent data might be hidden    Physical Exam   Physical Exam  Constitutional: She is oriented to person, place, and time. She appears well-developed and well-nourished.  HENT:  Head: Normocephalic and atraumatic.  Right Ear: External ear normal.  Left Ear: External ear normal.  Nose: Nose normal.  Mouth/Throat: Oropharynx is clear and moist.  TMs and canals are clear.   Eyes: Pupils are equal, round, and reactive to light.  Conjunctivae and EOM are normal.  Neck: Neck supple. No thyromegaly present.  Cardiovascular: Normal rate, regular rhythm and normal heart sounds.  Pulmonary/Chest: Effort normal and breath sounds normal. She has no wheezes.  Lymphadenopathy:    She has no cervical adenopathy.  Neurological: She is alert and oriented to person, place, and time.  Skin: Skin is warm and dry.  Psychiatric: She has a normal mood and affect.     Advanced Directives: Does Patient Have a Medical Advance Directive?: Yes Type of Advance Directive: Healthcare Power of Attorney, Living will Does patient want to make changes to medical advance directive?: No - Patient declined Copy of Healthcare Power of Attorney in Chart?: No - copy requested    Assessment:    This is a routine wellness examination for this patient .  Vision/Hearing screen No exam data present  Dietary issues and exercise activities discussed:  Current Exercise Habits: Home exercise routine, Type of exercise: walking, Time (Minutes): 30, Frequency (Times/Week): 4, Weekly Exercise (Minutes/Week): 120, Intensity: Moderate  Goals   None    Depression Screen PHQ 2/9 Scores 12/11/2017 12/11/2017 01/16/2017  PHQ - 2 Score 1 0 0     Fall Risk Fall Risk  12/11/2017  Falls in the past year? No    Cognitive Function:     6CIT Screen 12/11/2017  What Year? 0 points  What month? 0 points  What time? 0 points  Count back from 20 0 points  Months in reverse 0 points  Repeat phrase 0 points  Total Score 0    Patient Care Team: Agapito GamesMetheney, Catherine D, MD as PCP - General (Family Medicine) Michel HarrowBarham, Kelly, MD as Referring Physician (Dermatology)     Plan:    Welcome to Medicare  I have personally reviewed and noted the following in the patient's chart:   . Medical and social history . Use of alcohol, tobacco or illicit drugs  . Current medications and supplements - updated.  . Functional ability and status . Nutritional  status . Physical activity - Good!  . Advanced directives . List of other physicians . Hospitalizations, surgeries, and ER visits in previous 12 months . Vitals . Screenings to include cognitive, depression, and falls . Referrals and appointments . Flu vaccine given today.  She is interested in getting her pneumonia vaccine updated but just does not want to do it on the same day so we will offer again at her next appointment. . She did get her first shingles vaccine but has not completed her second because she felt so poorly for several weeks after the first 1. . EKG today shows rate of 84 bpm, normal sinus rhythm.  Probable old Q waves in lead V2, V3 and V4.  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  Of an old EKG on file from 2013 that also says possible infarct. Unchanged from previous . Screening mammo ordered.    In addition,  I have reviewed and discussed with patient certain preventive protocols, quality metrics, and best practice recommendations. A written personalized care plan for preventive services as well as general preventive health recommendations were provided to patient.     Nani Gasser, MD 12/11/2017

## 2017-12-11 NOTE — Addendum Note (Signed)
Addended by: Nani GasserMETHENEY, Loyde Orth D on: 12/11/2017 12:30 PM   Modules accepted: Orders

## 2017-12-11 NOTE — Patient Instructions (Addendum)
Ms. Kalisz , Thank you for taking time to come for your Medicare Wellness Visit. I appreciate your ongoing commitment to your health goals. Please review the following plan we discussed and let me know if I can assist you in the future.   These are the goals we discussed: Goals   None     This is a list of the screening recommended for you and due dates:  Health Maintenance  Topic Date Due  . Pneumonia vaccines (1 of 2 - PCV13) 08/14/2017  . Flu Shot  10/23/2017  . Mammogram  02/26/2018  . Pap Smear  09/19/2019  . DEXA scan (bone density measurement)  09/04/2023  . Tetanus Vaccine  06/01/2024  . Colon Cancer Screening  01/17/2025  .  Hepatitis C: One time screening is recommended by Center for Disease Control  (CDC) for  adults born from [birth year] through 1963.   Completed  . HIV Screening  Completed    Health Maintenance, Female Adopting a healthy lifestyle and getting preventive care can go a long way to promote health and wellness. Talk with your health care provider about what schedule of regular examinations is right for you. This is a good chance for you to check in with your provider about disease prevention and staying healthy. In between checkups, there are plenty of things you can do on your own. Experts have done a lot of research about which lifestyle changes and preventive measures are most likely to keep you healthy. Ask your health care provider for more information. Weight and diet Eat a healthy diet  Be sure to include plenty of vegetables, fruits, low-fat dairy products, and lean protein.  Do not eat a lot of foods high in solid fats, added sugars, or salt.  Get regular exercise. This is one of the most important things you can do for your health. ? Most adults should exercise for at least 150 minutes each week. The exercise should increase your heart rate and make you sweat (moderate-intensity exercise). ? Most adults should also do strengthening exercises at least  twice a week. This is in addition to the moderate-intensity exercise.  Maintain a healthy weight  Body mass index (BMI) is a measurement that can be used to identify possible weight problems. It estimates body fat based on height and weight. Your health care provider can help determine your BMI and help you achieve or maintain a healthy weight.  For females 31 years of age and older: ? A BMI below 18.5 is considered underweight. ? A BMI of 18.5 to 24.9 is normal. ? A BMI of 25 to 29.9 is considered overweight. ? A BMI of 30 and above is considered obese.  Watch levels of cholesterol and blood lipids  You should start having your blood tested for lipids and cholesterol at 65 years of age, then have this test every 5 years.  You may need to have your cholesterol levels checked more often if: ? Your lipid or cholesterol levels are high. ? You are older than 65 years of age. ? You are at high risk for heart disease.  Cancer screening Lung Cancer  Lung cancer screening is recommended for adults 82-39 years old who are at high risk for lung cancer because of a history of smoking.  A yearly low-dose CT scan of the lungs is recommended for people who: ? Currently smoke. ? Have quit within the past 15 years. ? Have at least a 30-pack-year history of smoking. A pack year  is smoking an average of one pack of cigarettes a day for 1 year.  Yearly screening should continue until it has been 15 years since you quit.  Yearly screening should stop if you develop a health problem that would prevent you from having lung cancer treatment.  Breast Cancer  Practice breast self-awareness. This means understanding how your breasts normally appear and feel.  It also means doing regular breast self-exams. Let your health care provider know about any changes, no matter how small.  If you are in your 20s or 30s, you should have a clinical breast exam (CBE) by a health care provider every 1-3 years as  part of a regular health exam.  If you are 79 or older, have a CBE every year. Also consider having a breast X-ray (mammogram) every year.  If you have a family history of breast cancer, talk to your health care provider about genetic screening.  If you are at high risk for breast cancer, talk to your health care provider about having an MRI and a mammogram every year.  Breast cancer gene (BRCA) assessment is recommended for women who have family members with BRCA-related cancers. BRCA-related cancers include: ? Breast. ? Ovarian. ? Tubal. ? Peritoneal cancers.  Results of the assessment will determine the need for genetic counseling and BRCA1 and BRCA2 testing.  Cervical Cancer Your health care provider may recommend that you be screened regularly for cancer of the pelvic organs (ovaries, uterus, and vagina). This screening involves a pelvic examination, including checking for microscopic changes to the surface of your cervix (Pap test). You may be encouraged to have this screening done every 3 years, beginning at age 12.  For women ages 66-65, health care providers may recommend pelvic exams and Pap testing every 3 years, or they may recommend the Pap and pelvic exam, combined with testing for human papilloma virus (HPV), every 5 years. Some types of HPV increase your risk of cervical cancer. Testing for HPV may also be done on women of any age with unclear Pap test results.  Other health care providers may not recommend any screening for nonpregnant women who are considered low risk for pelvic cancer and who do not have symptoms. Ask your health care provider if a screening pelvic exam is right for you.  If you have had past treatment for cervical cancer or a condition that could lead to cancer, you need Pap tests and screening for cancer for at least 20 years after your treatment. If Pap tests have been discontinued, your risk factors (such as having a new sexual partner) need to be  reassessed to determine if screening should resume. Some women have medical problems that increase the chance of getting cervical cancer. In these cases, your health care provider may recommend more frequent screening and Pap tests.  Colorectal Cancer  This type of cancer can be detected and often prevented.  Routine colorectal cancer screening usually begins at 65 years of age and continues through 65 years of age.  Your health care provider may recommend screening at an earlier age if you have risk factors for colon cancer.  Your health care provider may also recommend using home test kits to check for hidden blood in the stool.  A small camera at the end of a tube can be used to examine your colon directly (sigmoidoscopy or colonoscopy). This is done to check for the earliest forms of colorectal cancer.  Routine screening usually begins at age 20.  Direct  examination of the colon should be repeated every 5-10 years through 65 years of age. However, you may need to be screened more often if early forms of precancerous polyps or small growths are found.  Skin Cancer  Check your skin from head to toe regularly.  Tell your health care provider about any new moles or changes in moles, especially if there is a change in a mole's shape or color.  Also tell your health care provider if you have a mole that is larger than the size of a pencil eraser.  Always use sunscreen. Apply sunscreen liberally and repeatedly throughout the day.  Protect yourself by wearing long sleeves, pants, a wide-brimmed hat, and sunglasses whenever you are outside.  Heart disease, diabetes, and high blood pressure  High blood pressure causes heart disease and increases the risk of stroke. High blood pressure is more likely to develop in: ? People who have blood pressure in the high end of the normal range (130-139/85-89 mm Hg). ? People who are overweight or obese. ? People who are African American.  If you  are 62-66 years of age, have your blood pressure checked every 3-5 years. If you are 29 years of age or older, have your blood pressure checked every year. You should have your blood pressure measured twice-once when you are at a hospital or clinic, and once when you are not at a hospital or clinic. Record the average of the two measurements. To check your blood pressure when you are not at a hospital or clinic, you can use: ? An automated blood pressure machine at a pharmacy. ? A home blood pressure monitor.  If you are between 72 years and 42 years old, ask your health care provider if you should take aspirin to prevent strokes.  Have regular diabetes screenings. This involves taking a blood sample to check your fasting blood sugar level. ? If you are at a normal weight and have a low risk for diabetes, have this test once every three years after 65 years of age. ? If you are overweight and have a high risk for diabetes, consider being tested at a younger age or more often. Preventing infection Hepatitis B  If you have a higher risk for hepatitis B, you should be screened for this virus. You are considered at high risk for hepatitis B if: ? You were born in a country where hepatitis B is common. Ask your health care provider which countries are considered high risk. ? Your parents were born in a high-risk country, and you have not been immunized against hepatitis B (hepatitis B vaccine). ? You have HIV or AIDS. ? You use needles to inject street drugs. ? You live with someone who has hepatitis B. ? You have had sex with someone who has hepatitis B. ? You get hemodialysis treatment. ? You take certain medicines for conditions, including cancer, organ transplantation, and autoimmune conditions.  Hepatitis C  Blood testing is recommended for: ? Everyone born from [birth year] through 1963. ? Anyone with known risk factors for hepatitis C.  Sexually transmitted infections (STIs)  You should be  screened for sexually transmitted infections (STIs) including gonorrhea and chlamydia if: ? You are sexually active and are younger than 65 years of age. ? You are older than 65 years of age and your health care provider tells you that you are at risk for this type of infection. ? Your sexual activity has changed since you were last screened and you  are at an increased risk for chlamydia or gonorrhea. Ask your health care provider if you are at risk.  If you do not have HIV, but are at risk, it may be recommended that you take a prescription medicine daily to prevent HIV infection. This is called pre-exposure prophylaxis (PrEP). You are considered at risk if: ? You are sexually active and do not regularly use condoms or know the HIV status of your partner(s). ? You take drugs by injection. ? You are sexually active with a partner who has HIV.  Talk with your health care provider about whether you are at high risk of being infected with HIV. If you choose to begin PrEP, you should first be tested for HIV. You should then be tested every 3 months for as long as you are taking PrEP. Pregnancy  If you are premenopausal and you may become pregnant, ask your health care provider about preconception counseling.  If you may become pregnant, take 400 to 800 micrograms (mcg) of folic acid every day.  If you want to prevent pregnancy, talk to your health care provider about birth control (contraception). Osteoporosis and menopause  Osteoporosis is a disease in which the bones lose minerals and strength with aging. This can result in serious bone fractures. Your risk for osteoporosis can be identified using a bone density scan.  If you are 45 years of age or older, or if you are at risk for osteoporosis and fractures, ask your health care provider if you should be screened.  Ask your health care provider whether you should take a calcium or vitamin D supplement to lower your risk for  osteoporosis.  Menopause may have certain physical symptoms and risks.  Hormone replacement therapy may reduce some of these symptoms and risks. Talk to your health care provider about whether hormone replacement therapy is right for you. Follow these instructions at home:  Schedule regular health, dental, and eye exams.  Stay current with your immunizations.  Do not use any tobacco products including cigarettes, chewing tobacco, or electronic cigarettes.  If you are pregnant, do not drink alcohol.  If you are breastfeeding, limit how much and how often you drink alcohol.  Limit alcohol intake to no more than 1 drink per day for nonpregnant women. One drink equals 12 ounces of beer, 5 ounces of wine, or 1 ounces of hard liquor.  Do not use street drugs.  Do not share needles.  Ask your health care provider for help if you need support or information about quitting drugs.  Tell your health care provider if you often feel depressed.  Tell your health care provider if you have ever been abused or do not feel safe at home. This information is not intended to replace advice given to you by your health care provider. Make sure you discuss any questions you have with your health care provider. Document Released: 09/24/2010 Document Revised: 08/17/2015 Document Reviewed: 12/13/2014 Elsevier Interactive Patient Education  [employer].

## 2017-12-24 ENCOUNTER — Ambulatory Visit: Payer: Medicare Other | Admitting: Physician Assistant

## 2018-01-22 ENCOUNTER — Telehealth: Payer: Self-pay

## 2018-01-22 NOTE — Telephone Encounter (Signed)
Pt left VM stating she recently had her Valsartan increased and she states that is is causing some issues with a cough and possibly some breathing issues.  I called pt back and go her VM, advised her that if she is having any issues with SOB, breathing complications, or swelling in her throat she need to seek immediate care. Asked pt call back and give more specific SX to see whether this may truly be due to increase in med or something else

## 2018-01-26 NOTE — Telephone Encounter (Signed)
Called pt again, she reports she did get message.  Pt states she thought she was having some allergy/asthma issues. Remembered a couple years ago she had a similar issue with other BP medications.  She reduced her dose on her own and SX resolved. Reports that she is now increasing her dose again to see if same runny nose/cough.asthma SX return. Pt will call back with update later in the week. Does not really want to change medications because she feels valsartan works well.   FYI to PCP, will update when I hear back from pt

## 2018-03-05 ENCOUNTER — Telehealth: Payer: Self-pay | Admitting: Family Medicine

## 2018-03-05 ENCOUNTER — Other Ambulatory Visit: Payer: Self-pay | Admitting: Family Medicine

## 2018-03-05 MED ORDER — VALSARTAN-HYDROCHLOROTHIAZIDE 160-25 MG PO TABS: 1.0000 | ORAL_TABLET | Freq: Every day | ORAL | 0 refills | Status: DC

## 2018-03-05 NOTE — Telephone Encounter (Signed)
Refilled was denied for Valsartan due to patient needed a follow up appointment. Sent her to schedule an appointment and will send over #15 tabs until appt date. KG LPN E scribe down will have doctor sign and will fax to [phone] [facility]

## 2018-03-24 ENCOUNTER — Encounter

## 2018-03-24 ENCOUNTER — Ambulatory Visit: Payer: Medicare Other | Admitting: Family Medicine

## 2018-04-03 DIAGNOSIS — Z1231 Encounter for screening mammogram for malignant neoplasm of breast: Secondary | ICD-10-CM | POA: Diagnosis not present

## 2018-04-03 LAB — HM MAMMOGRAPHY

## 2018-04-09 ENCOUNTER — Encounter: Payer: Self-pay | Admitting: Family Medicine

## 2018-04-21 ENCOUNTER — Ambulatory Visit: Payer: Medicare Other | Admitting: Family Medicine

## 2018-05-01 ENCOUNTER — Ambulatory Visit (INDEPENDENT_AMBULATORY_CARE_PROVIDER_SITE_OTHER): Payer: Medicare Other | Admitting: Family Medicine

## 2018-05-01 ENCOUNTER — Ambulatory Visit (INDEPENDENT_AMBULATORY_CARE_PROVIDER_SITE_OTHER): Payer: Medicare Other

## 2018-05-01 ENCOUNTER — Encounter: Payer: Self-pay | Admitting: Family Medicine

## 2018-05-01 VITALS — BP 135/83 | HR 90 | Ht 65.0 in | Wt 158.0 lb

## 2018-05-01 DIAGNOSIS — M2540 Effusion, unspecified joint: Secondary | ICD-10-CM | POA: Diagnosis not present

## 2018-05-01 DIAGNOSIS — M19041 Primary osteoarthritis, right hand: Secondary | ICD-10-CM

## 2018-05-01 DIAGNOSIS — R768 Other specified abnormal immunological findings in serum: Secondary | ICD-10-CM | POA: Diagnosis not present

## 2018-05-01 DIAGNOSIS — R7689 Other specified abnormal immunological findings in serum: Secondary | ICD-10-CM

## 2018-05-01 DIAGNOSIS — I10 Essential (primary) hypertension: Secondary | ICD-10-CM | POA: Diagnosis not present

## 2018-05-01 DIAGNOSIS — M19049 Primary osteoarthritis, unspecified hand: Secondary | ICD-10-CM | POA: Diagnosis not present

## 2018-05-01 DIAGNOSIS — E790 Hyperuricemia without signs of inflammatory arthritis and tophaceous disease: Secondary | ICD-10-CM | POA: Diagnosis not present

## 2018-05-01 DIAGNOSIS — M19042 Primary osteoarthritis, left hand: Secondary | ICD-10-CM | POA: Diagnosis not present

## 2018-05-01 MED ORDER — VALSARTAN-HYDROCHLOROTHIAZIDE 160-25 MG PO TABS: 1.0000 | ORAL_TABLET | Freq: Every day | ORAL | 1 refills | Status: DC

## 2018-05-01 NOTE — Progress Notes (Signed)
Subjective:    CC: BP  HPI:  Hypertension- Pt denies chest pain, SOB, dizziness, or heart palpitations.  Taking meds as directed w/o problems.  Denies medication side effects.    She also wants to discuss her joints in her hands.  He says she really does not have a lot of pain or stiffness but does notice over the last several years she started to get some deformity particularly in the DIP joints on her left hand as well as some chronic swelling and enlargement of the MCPs of the hands.  She says her mother and grandmother had some deformity of their hands as well.  She is not sure if it was rheumatoid or osteo-.  She does have a daughter with Graves' disease and another daughter with ray nodes.  BP 135/83   Pulse 90   Ht 5\' 5"  (1.651 m)   Wt 158 lb (71.7 kg)   SpO2 98%   BMI 26.29 kg/m     Allergies  Allergen Reactions  . Irbesartan-Hydrochlorothiazide     Other reaction(s): Other (See Comments) Elevated blood pressure.  Marland Kitchen Percodan [Oxycodone-Aspirin] Nausea Only    Past Medical History:  Diagnosis Date  . Retinal detachment    both eyes     Past Surgical History:  Procedure Laterality Date  . arm fracture  1966   no hardware  . CHOLECYSTECTOMY  1990  . RETINAL DETACHMENT SURGERY Left 1996  . TONSILLECTOMY  1960    Social History   Socioeconomic History  . Marital status: Married    Spouse name: [name]  . Number of children: 2  . Years of education: BS Sociolo  . Highest education level: Not on file  Occupational History  . Occupation: Futures trader  Social Needs  . Financial resource strain: Not on file  . Food insecurity:    Worry: Not on file    Inability: Not on file  . Transportation needs:    Medical: Not on file    Non-medical: Not on file  Tobacco Use  . Smoking status: Never Smoker  . Smokeless tobacco: Never Used  Substance and Sexual Activity  . Alcohol use: Yes    Alcohol/week: 2.0 standard drinks    Types: 2 Glasses of wine per week  . Drug  use: No  . Sexual activity: Yes    Partners: Male    Birth control/protection: None  Lifestyle  . Physical activity:    Days per week: Not on file    Minutes per session: Not on file  . Stress: Not on file  Relationships  . Social connections:    Talks on phone: Not on file    Gets together: Not on file    Attends religious service: Not on file    Active member of club or organization: Not on file    Attends meetings of clubs or organizations: Not on file    Relationship status: Not on file  . Intimate partner violence:    Fear of current or ex partner: Not on file    Emotionally abused: Not on file    Physically abused: Not on file    Forced sexual activity: Not on file  Other Topics Concern  . Not on file  Social History Narrative   Exercises regularly by walking for 40-6- minutes 3 times a week. 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 She is married to [place] and has 2 adult children.       Family History  Problem Relation Age of Onset  . Prostate cancer Paternal Grandfather   . Depression Maternal Grandmother   . Hypertension Mother   . Alzheimer's disease Mother   . Parkinson's disease Mother   . Hypertension Sister   . Graves' disease Daughter   . Raynaud syndrome Daughter     Outpatient Encounter Medications as of 05/01/2018  Medication Sig  . albuterol (PROAIR HFA) 108 (90 Base) MCG/ACT inhaler Inhale 2 puffs into the lungs every 6 (six) hours as needed. 2 puffs 4 times a day as needed for shortness of breath  . BIOTIN PO Take by mouth.  . cetirizine (ZYRTEC) 10 MG tablet Take 10 mg by mouth as needed for allergies.  . cholecalciferol (VITAMIN D) 1000 units tablet Take 1,000 Units by mouth daily.  . DiphenhydrAMINE HCl (BENADRYL ALLERGY PO) Take by mouth as needed.  . fluticasone (FLONASE) 50 MCG/ACT nasal spray Place 1 spray into both nostrils 2 (two) times daily.  Marland Kitchen linaclotide (LINZESS) 290 MCG CAPS capsule Take 1 capsule (290 mcg total) by mouth daily  before breakfast.  . loratadine (CLARITIN) 10 MG tablet Take 10 mg by mouth daily.  Marland Kitchen MAGNESIUM PO Take by mouth.  . valsartan-hydrochlorothiazide (DIOVAN-HCT) 160-25 MG tablet Take 1 tablet by mouth daily.  . [DISCONTINUED] valsartan-hydrochlorothiazide (DIOVAN-HCT) 160-25 MG tablet Take 1 tablet by mouth daily.   No facility-administered encounter medications on file as of 05/01/2018.        Objective:    General: Well Developed, well nourished, and in no acute distress.  Neuro: Alert and oriented x3, extra-ocular muscles intact, sensation grossly intact.  HEENT: Normocephalic, atraumatic  Skin: Warm and dry, no rashes. Cardiac: Regular rate and rhythm, no murmurs rubs or gallops, no lower extremity edema.  Respiratory: Clear to auscultation bilaterally. Not using accessory muscles, speaking in full sentences. MSK: She has some swelling and redness over the first MCPs on both hands.  As well as a little bit of swelling over the second MCPs on both hands.  She has some deformity at the DIP joints.  She has significant enlargement of the PIPs on several fingers on both hands.  No wrist redness or swelling or tenderness.  No joint tenderness on exam today.   Impression and Recommendations:    HTN - Well controlled. Continue current regimen. Follow up in  21months.    Hand arthritis.  New problem.  Recommend evaluate for rheumatoid arthritis based on her exam today though she really does not get a lot of stiffness and pain which is typically needed to help confirm the diagnoses.  We will do some additional lab work as well as x-rays for further work-up.

## 2018-05-05 LAB — BASIC METABOLIC PANEL WITH GFR
BUN: 10 mg/dL (ref 7–25)
CO2: 28 mmol/L (ref 20–32)
Calcium: 10.5 mg/dL — ABNORMAL HIGH (ref 8.6–10.4)
Chloride: 95 mmol/L — ABNORMAL LOW (ref 98–110)
Creat: 0.9 mg/dL (ref 0.50–0.99)
GFR, EST AFRICAN AMERICAN: 78 mL/min/{1.73_m2} (ref >=60)
GFR, Est Non African American: 67 mL/min/{1.73_m2} (ref >=60)
Glucose, Bld: 85 mg/dL (ref 65–99)
Potassium: 3.6 mmol/L (ref 3.5–5.3)
Sodium: 135 mmol/L (ref 135–146)

## 2018-05-05 LAB — CBC
HCT: 44.8 % (ref 35.0–45.0)
Hemoglobin: 15.1 g/dL (ref 11.7–15.5)
MCH: 29.7 pg (ref 27.0–33.0)
MCHC: 33.7 g/dL (ref 32.0–36.0)
MCV: 88.2 fL (ref 80.0–100.0)
MPV: 10.6 fL (ref 7.5–12.5)
PLATELETS: 336 10*3/uL (ref 140–400)
RBC: 5.08 10*6/uL (ref 3.80–5.10)
RDW: 11.8 % (ref 11.0–15.0)
WBC: 6.8 10*3/uL (ref 3.8–10.8)

## 2018-05-05 LAB — ANTI-NUCLEAR AB-TITER (ANA TITER): ANA Titer 1: 1:1280 {titer} — ABNORMAL HIGH

## 2018-05-05 LAB — CYCLIC CITRUL PEPTIDE ANTIBODY, IGG

## 2018-05-05 LAB — RHEUMATOID FACTOR: Rheumatoid fact SerPl-aCnc: 15 IU/mL — ABNORMAL HIGH (ref <14)

## 2018-05-05 LAB — SEDIMENTATION RATE: Sed Rate: 6 mm/h (ref 0–30)

## 2018-05-05 LAB — URIC ACID: Uric Acid, Serum: 8 mg/dL — ABNORMAL HIGH (ref 2.5–7.0)

## 2018-05-05 LAB — ANA: Anti Nuclear Antibody(ANA): POSITIVE — AB

## 2018-05-06 NOTE — Addendum Note (Signed)
Addended by: Mallie Snooks R on: 05/06/2018 02:12 PM   Modules accepted: Orders

## 2018-05-08 DIAGNOSIS — M19041 Primary osteoarthritis, right hand: Secondary | ICD-10-CM | POA: Diagnosis not present

## 2018-05-08 DIAGNOSIS — R768 Other specified abnormal immunological findings in serum: Secondary | ICD-10-CM | POA: Diagnosis not present

## 2018-05-08 DIAGNOSIS — E790 Hyperuricemia without signs of inflammatory arthritis and tophaceous disease: Secondary | ICD-10-CM | POA: Diagnosis not present

## 2018-05-08 DIAGNOSIS — M8589 Other specified disorders of bone density and structure, multiple sites: Secondary | ICD-10-CM | POA: Diagnosis not present

## 2018-05-08 DIAGNOSIS — M19042 Primary osteoarthritis, left hand: Secondary | ICD-10-CM | POA: Diagnosis not present

## 2018-05-14 DIAGNOSIS — M8588 Other specified disorders of bone density and structure, other site: Secondary | ICD-10-CM | POA: Diagnosis not present

## 2018-06-08 ENCOUNTER — Other Ambulatory Visit: Payer: Self-pay

## 2018-06-08 DIAGNOSIS — M8580 Other specified disorders of bone density and structure, unspecified site: Secondary | ICD-10-CM

## 2018-06-08 DIAGNOSIS — Z114 Encounter for screening for human immunodeficiency virus [HIV]: Secondary | ICD-10-CM

## 2018-06-08 DIAGNOSIS — I10 Essential (primary) hypertension: Secondary | ICD-10-CM

## 2018-06-08 DIAGNOSIS — E049 Nontoxic goiter, unspecified: Secondary | ICD-10-CM

## 2018-06-08 DIAGNOSIS — Z1159 Encounter for screening for other viral diseases: Secondary | ICD-10-CM

## 2018-06-08 MED ORDER — ALBUTEROL SULFATE HFA 108 (90 BASE) MCG/ACT IN AERS: 2.0000 | INHALATION_SPRAY | Freq: Four times a day (QID) | RESPIRATORY_TRACT | 1 refills | Status: DC | PRN

## 2018-10-30 ENCOUNTER — Ambulatory Visit: Payer: Medicare Other | Admitting: Family Medicine

## 2018-11-26 ENCOUNTER — Other Ambulatory Visit: Payer: Self-pay | Admitting: Family Medicine

## 2018-11-26 DIAGNOSIS — M19049 Primary osteoarthritis, unspecified hand: Secondary | ICD-10-CM

## 2018-11-26 DIAGNOSIS — I10 Essential (primary) hypertension: Secondary | ICD-10-CM

## 2019-03-04 ENCOUNTER — Encounter: Payer: Self-pay | Admitting: Family Medicine

## 2019-03-04 ENCOUNTER — Ambulatory Visit (INDEPENDENT_AMBULATORY_CARE_PROVIDER_SITE_OTHER): Payer: Medicare Other | Admitting: Family Medicine

## 2019-03-04 DIAGNOSIS — Z23 Encounter for immunization: Secondary | ICD-10-CM | POA: Diagnosis not present

## 2019-03-04 DIAGNOSIS — Z20828 Contact with and (suspected) exposure to other viral communicable diseases: Secondary | ICD-10-CM | POA: Diagnosis not present

## 2019-03-04 DIAGNOSIS — Z20822 Contact with and (suspected) exposure to covid-19: Secondary | ICD-10-CM

## 2019-03-04 NOTE — Progress Notes (Signed)
Agree with documentation as above.   Catherine Metheney, MD  

## 2019-03-04 NOTE — Progress Notes (Signed)
Patient seen for drive-up testing for Covid-19. She denies any symptoms. She has a grandchild that is due in the next two weeks and would like to make sure she does not have the virus prior to it's birth. Covid swab performed with no apparent issues. Patient will be contacted with results when available.

## 2019-03-05 LAB — NOVEL CORONAVIRUS, NAA: SARS-CoV-2, NAA: NOT DETECTED

## 2019-05-07 DIAGNOSIS — Z23 Encounter for immunization: Secondary | ICD-10-CM | POA: Diagnosis not present

## 2019-06-05 DIAGNOSIS — Z23 Encounter for immunization: Secondary | ICD-10-CM | POA: Diagnosis not present

## 2019-06-07 ENCOUNTER — Ambulatory Visit (INDEPENDENT_AMBULATORY_CARE_PROVIDER_SITE_OTHER): Payer: Medicare Other | Admitting: Family Medicine

## 2019-06-07 ENCOUNTER — Encounter: Payer: Self-pay | Admitting: Family Medicine

## 2019-06-07 ENCOUNTER — Other Ambulatory Visit: Payer: Self-pay

## 2019-06-07 DIAGNOSIS — T783XXA Angioneurotic edema, initial encounter: Secondary | ICD-10-CM

## 2019-06-07 MED ORDER — PREDNISONE 20 MG PO TABS: 20.0000 mg | ORAL_TABLET | Freq: Two times a day (BID) | ORAL | 0 refills | Status: AC

## 2019-06-07 NOTE — Patient Instructions (Signed)
Angioedema Angioedema is the sudden swelling of tissue in the body. Angioedema can affect any part of the body, but it most often affects the deeper parts of the skin, causing red, itchy patches (hives) to appear over the affected area. It often begins during the night and is found in the morning. Depending on the cause, angioedema may happen:  Only once.  Several times. It may come back in unpredictable patterns.  Repeatedly for several years. Over time, it may gradually stop coming back. Angioedema can be life-threatening if it affects the air passages that you breathe through. What are the causes? This condition may be caused by:  Foods, such as milk, eggs, shellfish, wheat, or nuts.  Certain medicines, such as ACE inhibitors, antibiotics, nonsteroidal anti-inflammatory drugs, birth control pills, or dyes used in X-rays.  Insect stings.  Infections. Angioedema can be inherited, and episodes can be triggered by:  Mild injury.  Dental work.  Surgery.  Stress.  Sudden changes in temperature.  Exercise. In some cases, the cause of this condition is not known. What are the signs or symptoms? Symptoms of this condition depend on where the swelling happens. Symptoms may include:  Swollen skin.  Red, itchy patches of skin (hives).  Redness in the affected area.  Pain in the affected area.  Swollen lips or tongue.  Wheezing.  Breathing problems.  If your internal organs are involved, symptoms may also include:  Nausea.  Abdominal pain.  Vomiting.  Difficulty swallowing.  Difficulty passing urine. How is this diagnosed? This condition may be diagnosed based on:  An exam of the affected area.  Your medical history.  Whether anyone in your family has had this condition before.  A review of any medicines you have been taking.  Tests, including: ? Allergy skin tests to see if the condition was caused by an allergic reaction. ? Blood tests to see if the  condition was caused by a gene. ? Tests to check for underlying diseases that could cause the condition. How is this treated? Treatment for this condition depends on the cause. It may involve any of the following:  If something triggered the condition, making changes to keep it from triggering the condition again.  If the condition affects your breathing, having tubes placed in your airway to keep it open.  Taking medicines to treat symptoms or prevent future episodes. These may include: ? Antihistamines. ? Epinephrine injections. ? Steroids. If your condition is severe, you may need to be treated at the hospital. Angioedema usually gets better in 24-48 hours. Follow these instructions at home:  Take over-the-counter and prescription medicines only as told by your health care provider.  If you were given medicines for emergency allergy treatment, always carry them with you.  Wear a medical bracelet as told by your health care provider.  If something triggers your condition, avoid the trigger, if possible.  If your condition is inherited and you are thinking about having children, talk to your health care provider. It is important to discuss the risks of passing on the condition to your children. Contact a health care provider if:  You have repeated episodes of angioedema.  Episodes of angioedema start to happen more often than they used to, even after you take steps to prevent them.  You have episodes of angioedema that are more severe than they have been before, even after you take steps to prevent them.  You are thinking about having children. Get help right away if:  You   have severe swelling of your mouth, tongue, or lips.  You have trouble breathing.  You have trouble swallowing.  You faint. This information is not intended to replace advice given to you by your health care provider. Make sure you discuss any questions you have with your health care provider. Document  Revised: 02/21/2017 Document Reviewed: 09/19/2015 Elsevier Patient Education  2020 Elsevier Inc.  

## 2019-06-07 NOTE — Assessment & Plan Note (Addendum)
Lips swollen today however without any additional symptoms.  No signs of airway compromise on exam.  She may continue benadryl every 4-6 hours as needed.  We discussed adding steroids.  Discussed that this may diminish her immune response to recent vaccine.  She would like to hold off on starting steroids to see if this improves on its own.  I let her know that I will go ahead and send in prednisone 20mg  BID x5 days, for her to have on hand in case this does not start to improve.  She is given instructions to seek emergency care if she develops wheezing, shortness of breath, dizziness/lightheadedness.  She express understanding

## 2019-06-07 NOTE — Progress Notes (Signed)
Amanda Moyer - 69 y.o. female MRN [id number]  Date of birth: 01/04/1951  Subjective Chief Complaint  Patient presents with  . Allergic Reaction    HPI Amanda Moyer is a 69 y.o. female here today with c/o possible allergic reaction.  She reports that she received her 2nd vaccine for COVID-19 on Saturday.  She noticed that lips were tingly and swollen last night and more swollen this morning.  She took benadryl with some improvement in itching.  She is not having any tongue swelling, shortness of breath, difficulty swallowing, dizziness, weakness.   She has never had reactions to vaccines in the past.   ROS:  A comprehensive ROS was completed and negative except as noted per HPI  Allergies  Allergen Reactions  . Irbesartan-Hydrochlorothiazide     Other reaction(s): Other (See Comments) Elevated blood pressure.  Marland Kitchen Percodan [Oxycodone-Aspirin] Nausea Only    Past Medical History:  Diagnosis Date  . Retinal detachment    both eyes     Past Surgical History:  Procedure Laterality Date  . arm fracture  1966   no hardware  . CHOLECYSTECTOMY  1990  . RETINAL DETACHMENT SURGERY Left 1996  . TONSILLECTOMY  1960    Social History   Socioeconomic History  . Marital status: Married    Spouse name: [name]  . Number of children: 2  . Years of education: BS Sociolo  . Highest education level: Not on file  Occupational History  . Occupation: Homemaker  Tobacco Use  . Smoking status: Never Smoker  . Smokeless tobacco: Never Used  Substance and Sexual Activity  . Alcohol use: Yes    Alcohol/week: 2.0 standard drinks    Types: 2 Glasses of wine per week  . Drug use: No  . Sexual activity: Yes    Partners: Male    Birth control/protection: None  Other Topics Concern  . Not on file  Social History Narrative   Exercises regularly by walking for 40-6- minutes 3 times a week. 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 She is married to [place] and has 2 adult children.       Social Determinants of Health   Financial Resource Strain:   . Difficulty of Paying Living Expenses:   Food Insecurity:   . Worried About Programme researcher, broadcasting/film/video in the Last Year:   . Barista in the Last Year:   Transportation Needs:   . Freight forwarder (Medical):   Marland Kitchen Lack of Transportation (Non-Medical):   Physical Activity:   . Days of Exercise per Week:   . Minutes of Exercise per Session:   Stress:   . Feeling of Stress :   Social Connections:   . Frequency of Communication with Friends and Family:   . Frequency of Social Gatherings with Friends and Family:   . Attends Religious Services:   . Active Member of Clubs or Organizations:   . Attends Banker Meetings:   Marland Kitchen Marital Status:     Family History  Problem Relation Age of Onset  . Prostate cancer Paternal Grandfather   . Depression Maternal Grandmother   . Hypertension Mother   . Alzheimer's disease Mother   . Parkinson's disease Mother   . Hypertension Sister   . Graves' disease Daughter   . Raynaud syndrome Daughter     Health Maintenance  Topic Date Due  . PNA vac Low Risk Adult (2 of 2 - PPSV23) 08/05/2018  . INFLUENZA VACCINE  10/24/2018  . MAMMOGRAM  04/03/2020  . DEXA SCAN  09/04/2023  . TETANUS/TDAP  06/01/2024  . COLONOSCOPY  01/17/2025  . Hepatitis C Screening  Completed     ----------------------------------------------------------------------------------------------------------------------------------------------------------------------------------------------------------------- Physical Exam BP 136/81   Pulse 83   Temp 98.2 F (36.8 C) (Oral)   Wt 161 lb (73 kg)   SpO2 99% Comment: on RA  BMI 26.79 kg/m   Physical Exam Constitutional:      Appearance: Normal appearance.  HENT:     Head: Normocephalic and atraumatic.     Mouth/Throat:     Mouth: Mucous membranes are moist.     Comments: Lips swollen.  No tongue swelling.  Eyes:     General: No scleral  icterus. Cardiovascular:     Rate and Rhythm: Normal rate and regular rhythm.  Pulmonary:     Effort: Pulmonary effort is normal.     Breath sounds: Normal breath sounds.  Skin:    General: Skin is warm and dry.  Neurological:     General: No focal deficit present.     Mental Status: She is alert.  Psychiatric:        Mood and Affect: Mood normal.        Behavior: Behavior normal.     ------------------------------------------------------------------------------------------------------------------------------------------------------------------------------------------------------------------- Assessment and Plan  Angioedema Lips swollen today however without any additional symptoms.  No signs of airway compromise on exam.  She may continue benadryl every 4-6 hours as needed.  We discussed adding steroids.  Discussed that this may diminish her immune response to recent vaccine.  She would like to hold off on starting steroids to see if this improves on its own.  I let her know that I will go ahead and send in prednisone 20mg  BID x5 days, for her to have on hand in case this does not start to improve.  She is given instructions to seek emergency care if she develops wheezing, shortness of breath, dizziness/lightheadedness.  She express understanding   Meds ordered this encounter  Medications  . predniSONE (DELTASONE) 20 MG tablet    Sig: Take 1 tablet (20 mg total) by mouth 2 (two) times daily with a meal for 5 days.    Dispense:  10 tablet    Refill:  0    No follow-ups on file.    This visit occurred during the SARS-CoV-2 public health emergency.  Safety protocols were in place, including screening questions prior to the visit, additional usage of staff PPE, and extensive cleaning of exam room while observing appropriate contact time as indicated for disinfecting solutions.

## 2019-06-19 ENCOUNTER — Other Ambulatory Visit: Payer: Self-pay | Admitting: Family Medicine

## 2019-06-19 DIAGNOSIS — M19049 Primary osteoarthritis, unspecified hand: Secondary | ICD-10-CM

## 2019-06-19 DIAGNOSIS — I10 Essential (primary) hypertension: Secondary | ICD-10-CM

## 2019-07-05 ENCOUNTER — Other Ambulatory Visit: Payer: Self-pay | Admitting: Family Medicine

## 2019-07-05 DIAGNOSIS — I10 Essential (primary) hypertension: Secondary | ICD-10-CM

## 2019-07-05 DIAGNOSIS — M19049 Primary osteoarthritis, unspecified hand: Secondary | ICD-10-CM

## 2019-07-16 DIAGNOSIS — Z91048 Other nonmedicinal substance allergy status: Secondary | ICD-10-CM | POA: Diagnosis not present

## 2019-07-16 DIAGNOSIS — W010XXA Fall on same level from slipping, tripping and stumbling without subsequent striking against object, initial encounter: Secondary | ICD-10-CM | POA: Diagnosis not present

## 2019-07-16 DIAGNOSIS — I10 Essential (primary) hypertension: Secondary | ICD-10-CM | POA: Diagnosis not present

## 2019-07-16 DIAGNOSIS — S52591A Other fractures of lower end of right radius, initial encounter for closed fracture: Secondary | ICD-10-CM | POA: Diagnosis not present

## 2019-07-16 DIAGNOSIS — S52501A Unspecified fracture of the lower end of right radius, initial encounter for closed fracture: Secondary | ICD-10-CM | POA: Diagnosis not present

## 2019-07-16 DIAGNOSIS — S62101A Fracture of unspecified carpal bone, right wrist, initial encounter for closed fracture: Secondary | ICD-10-CM | POA: Diagnosis not present

## 2019-07-16 DIAGNOSIS — S6291XA Unspecified fracture of right wrist and hand, initial encounter for closed fracture: Secondary | ICD-10-CM | POA: Diagnosis not present

## 2019-07-16 DIAGNOSIS — Y9301 Activity, walking, marching and hiking: Secondary | ICD-10-CM | POA: Diagnosis not present

## 2019-07-17 DIAGNOSIS — S52591A Other fractures of lower end of right radius, initial encounter for closed fracture: Secondary | ICD-10-CM | POA: Diagnosis not present

## 2019-07-20 DIAGNOSIS — S62101A Fracture of unspecified carpal bone, right wrist, initial encounter for closed fracture: Secondary | ICD-10-CM | POA: Diagnosis not present

## 2019-07-20 DIAGNOSIS — Z1159 Encounter for screening for other viral diseases: Secondary | ICD-10-CM | POA: Diagnosis not present

## 2019-07-23 DIAGNOSIS — S52571A Other intraarticular fracture of lower end of right radius, initial encounter for closed fracture: Secondary | ICD-10-CM | POA: Diagnosis not present

## 2019-07-23 DIAGNOSIS — G8918 Other acute postprocedural pain: Secondary | ICD-10-CM | POA: Diagnosis not present

## 2019-07-23 DIAGNOSIS — M1990 Unspecified osteoarthritis, unspecified site: Secondary | ICD-10-CM | POA: Diagnosis not present

## 2019-07-23 DIAGNOSIS — I10 Essential (primary) hypertension: Secondary | ICD-10-CM | POA: Diagnosis not present

## 2019-07-23 DIAGNOSIS — S52501A Unspecified fracture of the lower end of right radius, initial encounter for closed fracture: Secondary | ICD-10-CM | POA: Diagnosis not present

## 2019-08-06 DIAGNOSIS — S62101A Fracture of unspecified carpal bone, right wrist, initial encounter for closed fracture: Secondary | ICD-10-CM | POA: Diagnosis not present

## 2019-08-10 ENCOUNTER — Other Ambulatory Visit: Payer: Self-pay | Admitting: Family Medicine

## 2019-08-10 DIAGNOSIS — Z1159 Encounter for screening for other viral diseases: Secondary | ICD-10-CM

## 2019-08-10 DIAGNOSIS — M8580 Other specified disorders of bone density and structure, unspecified site: Secondary | ICD-10-CM

## 2019-08-10 DIAGNOSIS — E049 Nontoxic goiter, unspecified: Secondary | ICD-10-CM

## 2019-08-10 DIAGNOSIS — I10 Essential (primary) hypertension: Secondary | ICD-10-CM

## 2019-08-10 DIAGNOSIS — Z114 Encounter for screening for human immunodeficiency virus [HIV]: Secondary | ICD-10-CM

## 2019-08-18 DIAGNOSIS — S52551A Other extraarticular fracture of lower end of right radius, initial encounter for closed fracture: Secondary | ICD-10-CM | POA: Diagnosis not present

## 2019-08-18 DIAGNOSIS — I10 Essential (primary) hypertension: Secondary | ICD-10-CM | POA: Diagnosis not present

## 2019-08-18 DIAGNOSIS — M1990 Unspecified osteoarthritis, unspecified site: Secondary | ICD-10-CM | POA: Diagnosis not present

## 2019-08-18 DIAGNOSIS — G8918 Other acute postprocedural pain: Secondary | ICD-10-CM | POA: Diagnosis not present

## 2019-08-18 DIAGNOSIS — S52571A Other intraarticular fracture of lower end of right radius, initial encounter for closed fracture: Secondary | ICD-10-CM | POA: Diagnosis not present

## 2019-09-01 DIAGNOSIS — S52509A Unspecified fracture of the lower end of unspecified radius, initial encounter for closed fracture: Secondary | ICD-10-CM | POA: Diagnosis not present

## 2019-09-10 ENCOUNTER — Other Ambulatory Visit: Payer: Self-pay | Admitting: Family Medicine

## 2019-09-10 DIAGNOSIS — M19049 Primary osteoarthritis, unspecified hand: Secondary | ICD-10-CM

## 2019-09-10 DIAGNOSIS — I10 Essential (primary) hypertension: Secondary | ICD-10-CM

## 2019-09-28 DIAGNOSIS — S62101A Fracture of unspecified carpal bone, right wrist, initial encounter for closed fracture: Secondary | ICD-10-CM | POA: Diagnosis not present

## 2019-10-05 DIAGNOSIS — S62101A Fracture of unspecified carpal bone, right wrist, initial encounter for closed fracture: Secondary | ICD-10-CM | POA: Diagnosis not present

## 2019-10-12 DIAGNOSIS — S62101A Fracture of unspecified carpal bone, right wrist, initial encounter for closed fracture: Secondary | ICD-10-CM | POA: Diagnosis not present

## 2019-10-21 DIAGNOSIS — M25531 Pain in right wrist: Secondary | ICD-10-CM | POA: Diagnosis not present

## 2019-11-04 DIAGNOSIS — M25531 Pain in right wrist: Secondary | ICD-10-CM | POA: Diagnosis not present

## 2019-11-10 DIAGNOSIS — S6991XD Unspecified injury of right wrist, hand and finger(s), subsequent encounter: Secondary | ICD-10-CM | POA: Diagnosis not present

## 2020-02-02 ENCOUNTER — Other Ambulatory Visit: Payer: Self-pay

## 2020-02-02 DIAGNOSIS — I10 Essential (primary) hypertension: Secondary | ICD-10-CM

## 2020-02-02 DIAGNOSIS — M19049 Primary osteoarthritis, unspecified hand: Secondary | ICD-10-CM

## 2020-02-02 DIAGNOSIS — E7800 Pure hypercholesterolemia, unspecified: Secondary | ICD-10-CM

## 2020-02-02 DIAGNOSIS — Z0000 Encounter for general adult medical examination without abnormal findings: Secondary | ICD-10-CM

## 2020-02-02 MED ORDER — VALSARTAN-HYDROCHLOROTHIAZIDE 160-25 MG PO TABS: 1.0000 | ORAL_TABLET | Freq: Every day | ORAL | 0 refills | Status: DC

## 2020-02-02 NOTE — Telephone Encounter (Signed)
Amanda Moyer called for labs and refill on Valsartan-HCTZ. Appointment in 2 weeks.

## 2020-02-08 ENCOUNTER — Ambulatory Visit: Payer: Medicare Other | Admitting: Family Medicine

## 2020-02-14 ENCOUNTER — Ambulatory Visit: Payer: Medicare Other | Admitting: Family Medicine

## 2020-02-26 ENCOUNTER — Other Ambulatory Visit: Payer: Self-pay | Admitting: Family Medicine

## 2020-02-26 DIAGNOSIS — I10 Essential (primary) hypertension: Secondary | ICD-10-CM

## 2020-02-26 DIAGNOSIS — M19049 Primary osteoarthritis, unspecified hand: Secondary | ICD-10-CM

## 2020-02-28 ENCOUNTER — Encounter: Payer: Self-pay | Admitting: Family Medicine

## 2020-02-28 ENCOUNTER — Ambulatory Visit (INDEPENDENT_AMBULATORY_CARE_PROVIDER_SITE_OTHER): Payer: Medicare Other | Admitting: Family Medicine

## 2020-02-28 ENCOUNTER — Other Ambulatory Visit: Payer: Self-pay

## 2020-02-28 VITALS — BP 136/72 | HR 83 | Ht 65.0 in

## 2020-02-28 DIAGNOSIS — I10 Essential (primary) hypertension: Secondary | ICD-10-CM

## 2020-02-28 DIAGNOSIS — M19049 Primary osteoarthritis, unspecified hand: Secondary | ICD-10-CM

## 2020-02-28 DIAGNOSIS — Z0000 Encounter for general adult medical examination without abnormal findings: Secondary | ICD-10-CM | POA: Diagnosis not present

## 2020-02-28 DIAGNOSIS — Z23 Encounter for immunization: Secondary | ICD-10-CM | POA: Diagnosis not present

## 2020-02-28 LAB — CBC
HCT: 44.1 % (ref 35.0–45.0)
Hemoglobin: 15.2 g/dL (ref 11.7–15.5)
MCH: 30.9 pg (ref 27.0–33.0)
MCHC: 34.5 g/dL (ref 32.0–36.0)
MCV: 89.6 fL (ref 80.0–100.0)
MPV: 10.8 fL (ref 7.5–12.5)
Platelets: 298 10*3/uL (ref 140–400)
RBC: 4.92 10*6/uL (ref 3.80–5.10)
RDW: 11.8 % (ref 11.0–15.0)
WBC: 8.3 10*3/uL (ref 3.8–10.8)

## 2020-02-28 LAB — COMPLETE METABOLIC PANEL WITH GFR
AG Ratio: 1.8 (calc) (ref 1.0–2.5)
ALT: 17 U/L (ref 6–29)
AST: 20 U/L (ref 10–35)
Albumin: 4.6 g/dL (ref 3.6–5.1)
Alkaline phosphatase (APISO): 53 U/L (ref 37–153)
BUN: 13 mg/dL (ref 7–25)
CO2: 30 mmol/L (ref 20–32)
Calcium: 9.9 mg/dL (ref 8.6–10.4)
Chloride: 98 mmol/L (ref 98–110)
Creat: 0.81 mg/dL (ref 0.50–0.99)
GFR, Est African American: 87 mL/min/{1.73_m2} (ref >=60)
GFR, Est Non African American: 75 mL/min/{1.73_m2} (ref >=60)
Globulin: 2.6 g/dL (calc) (ref 1.9–3.7)
Glucose, Bld: 98 mg/dL (ref 65–99)
Potassium: 3.7 mmol/L (ref 3.5–5.3)
Sodium: 136 mmol/L (ref 135–146)
Total Bilirubin: 0.9 mg/dL (ref 0.2–1.2)
Total Protein: 7.2 g/dL (ref 6.1–8.1)

## 2020-02-28 LAB — LIPID PANEL
Cholesterol: 208 mg/dL — ABNORMAL HIGH (ref <200)
HDL: 80 mg/dL (ref >=50)
LDL Cholesterol (Calc): 104 mg/dL (calc) — ABNORMAL HIGH
Non-HDL Cholesterol (Calc): 128 mg/dL (calc) (ref <130)
Total CHOL/HDL Ratio: 2.6 (calc) (ref <5.0)
Triglycerides: 141 mg/dL (ref <150)

## 2020-02-28 MED ORDER — VALSARTAN-HYDROCHLOROTHIAZIDE 160-25 MG PO TABS: ORAL_TABLET | ORAL | 1 refills | Status: DC

## 2020-02-28 MED ORDER — LINACLOTIDE 290 MCG PO CAPS
290.0000 ug | ORAL_CAPSULE | Freq: Every day | ORAL | 1 refills | Status: DC
Start: 2020-02-28 — End: 2022-10-31

## 2020-02-28 NOTE — Patient Instructions (Signed)
  Ms. Biswell , Thank you for taking time to come for your Medicare Wellness Visit. I appreciate your ongoing commitment to your health goals. Please review the following plan we discussed and let me know if I can assist you in the future.   These are the goals we discussed: Goals   None    Keep up your exercise.     This is a list of the screening recommended for you and due dates:  Health Maintenance  Topic Date Due  . Mammogram  04/03/2020  . DEXA scan (bone density measurement)  09/04/2023  . Tetanus Vaccine  06/01/2024  . Colon Cancer Screening  01/17/2025  . Flu Shot  Completed  . COVID-19 Vaccine  Completed  .  Hepatitis C: One time screening is recommended by Center for Disease Control  (CDC) for  adults born from [birth year] through 1963.   Completed  . Pneumonia vaccines  Completed

## 2020-02-28 NOTE — Assessment & Plan Note (Signed)
Due for labs and refills.

## 2020-02-28 NOTE — Progress Notes (Signed)
Subjective:   Amanda Moyer is a 69 y.o. female who presents for an Initial Medicare Annual Wellness Visit.  Review of Systems           Objective:    Today's Vitals   02/28/20 1017 02/28/20 1109  BP: (!) 147/74 136/72  Pulse: 83   SpO2: 100%   Height: 5\' 5"  (1.651 m)    Body mass index is 26.79 kg/m.  Advanced Directives 02/28/2020 12/11/2017 05/12/2014  Does Patient Have a Medical Advance Directive? Yes Yes Yes  Type of Advance Directive Living will;Healthcare Power of 05/14/2014 Power of Amanda Moyer;Living will Living will  Does patient want to make changes to medical advance directive? - No - Patient declined No - Patient declined  Copy of Healthcare Power of Attorney in Chart? No - copy requested No - copy requested No - copy requested    Current Medications (verified):  See updated med list.    Allergies (verified) Irbesartan-hydrochlorothiazide and Percodan [oxycodone-aspirin]   History: Past Medical History:  Diagnosis Date  . Retinal detachment    both eyes    Past Surgical History:  Procedure Laterality Date  . arm fracture  1966   no hardware  . CHOLECYSTECTOMY  1990  . RETINAL DETACHMENT SURGERY Left 1996  . TONSILLECTOMY  1960   Family History  Problem Relation Age of Onset  . Prostate cancer Paternal Grandfather   . Depression Maternal Grandmother   . Hypertension Mother   . Alzheimer's disease Mother   . Parkinson's disease Mother   . Hypertension Sister   . Graves' disease Daughter   . Raynaud syndrome Daughter    Social History   Socioeconomic History  . Marital status: Married    Spouse name: Amanda Moyer  . Number of children: 2  . Years of education: BS Sociolo  . Highest education level: Not on file  Occupational History  . Occupation: Homemaker  Tobacco Use  . Smoking status: Never Smoker  . Smokeless tobacco: Never Used  Substance and Sexual Activity  . Alcohol use: Yes    Alcohol/week: 2.0 standard drinks    Types: 2  Glasses of wine per week  . Drug use: No  . Sexual activity: Yes    Partners: Male    Birth control/protection: None  Other Topics Concern  . Not on file  Social History Narrative   Exercises regularly by walking for 40-6- minutes 3 times a week. 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 She is married to [place] and has 2 adult children.      Social Determinants of Health   Financial Resource Strain:   . Difficulty of Paying Living Expenses: Not on file  Food Insecurity:   . Worried About Amanda Moyer in the Last Year: Not on file  . Ran Out of Food in the Last Year: Not on file  Transportation Needs:   . Lack of Transportation (Medical): Not on file  . Lack of Transportation (Non-Medical): Not on file  Physical Activity:   . Days of Exercise per Week: Not on file  . Minutes of Exercise per Session: Not on file  Stress:   . Feeling of Stress : Not on file  Social Connections:   . Frequency of Communication with Friends and Family: Not on file  . Frequency of Social Gatherings with Friends and Family: Not on file  . Attends Religious Services: Not on file  . Active Member of Clubs or Organizations: Not on  file  . Attends Banker Meetings: Not on file  . Marital Status: Not on file    Tobacco Counseling Counseling given: Not Answered   Clinical Intake:  Pre-visit preparation completed: Yes Mammo and DEXA done at [facility] in 2020 Pain : No/denies pain     BMI - recorded: 26 Nutritional Status: BMI 25 -29 Overweight Diabetes: No     Diabetic?No  Interpreter Needed?: No      Activities of Daily Living In your present state of health, do you have any difficulty performing the following activities: 02/28/2020  Hearing? N  Vision? N  Difficulty concentrating or making decisions? N  Walking or climbing stairs? N  Dressing or bathing? N  Doing errands, shopping? N  Preparing Food and eating ? N  Using the Toilet? N  In the past six months,  have you accidently leaked urine? N  Do you have problems with loss of bowel control? N  Managing your Medications? N  Managing your Finances? N  Housekeeping or managing your Housekeeping? N  Some recent data might be hidden    Patient Care Team: Agapito Games, MD as PCP - General (Family Medicine) Michel Harrow, MD as Referring Physician (Dermatology)  Indicate any recent Medical Services you may have received from other than [facility] providers in the past year (date may be approximate).     Assessment:   This is a routine wellness examination for Amanda Moyer.  Hearing/Vision screen No exam data present  Dietary issues and exercise activities discussed: Current Exercise Habits: Home exercise routine, Type of exercise: walking (2 miles), Time (Minutes): 30, Frequency (Times/Week): 7, Weekly Exercise (Minutes/Week): 150, Intensity: Mild  Goals   None    Depression Screen PHQ 2/9 Scores 02/28/2020 02/28/2020 05/01/2018 12/11/2017 12/11/2017 01/16/2017  PHQ - 2 Score 0 0 1 1 0 0    Fall Risk Fall Risk  02/28/2020 12/11/2017 12/11/2017  Falls in the past year? 1 No No  Number falls in past yr: 0 - -  Injury with Fall? 1 - -  Risk for fall due to : No Fall Risks - -  Follow up Falls prevention discussed - -    FALL RISK PREVENTION PERTAINING TO THE HOME:   She did fall this year when she was out of town. There was some plastic mesh on the ground that was ground cover. She and her husband were out walking her dog when her foot caught on the mesh and caused her to fall forward and tripped. She stretched out her right hand and wrist and actually fractured her wrist. Required surgical correction. The provider was concerned about her having osteoporosis she does take calcium, vitamin D and exercises regularly. Bone density is up-to-date.  ASSISTIVE DEVICES UTILIZED TO PREVENT FALLS:  Life alert? No  Use of a cane, walker or w/c? No  Grab bars in the bathroom? No  Shower chair or  bench in shower? No  Elevated toilet seat or a handicapped toilet? No   TIMED UP AND GO:  Was the test performed? No .  Length of time to ambulate 10 feet: NA Gait steady and fast without use of assistive device  Cognitive Function:     6CIT Screen 12/11/2017  What Year? 0 points  What month? 0 points  What time? 0 points  Count back from 20 0 points  Months in reverse 0 points  Repeat phrase 0 points  Total Score 0    Immunizations Immunization History  Administered Date(s)  Administered  . Fluad Quad(high Dose 65+) 02/28/2020  . Influenza,inj,Quad PF,6+ Mos 11/23/2013, 12/27/2014, 05/02/2016, 12/11/2017  . Moderna SARS-COVID-2 Vaccination 05/07/2019, 06/05/2019  . PPD Test 06/01/2004  . Pneumococcal Conjugate-13 08/04/2017  . Pneumococcal Polysaccharide-23 02/28/2020  . Td (Adult) 06/01/2004  . Tdap 06/02/2014  . Zoster Recombinat (Shingrix) 08/27/2017    TDAP status: Up to date  Flu Vaccine status: Completed at today's visit  Pneumococcal vaccine status: Completed during today's visit.  Covid-19 vaccine status: Completed vaccines  Qualifies for Shingles Vaccine? No     Screening Tests Health Maintenance  Topic Date Due  . MAMMOGRAM  04/03/2020  . DEXA SCAN  09/04/2023  . TETANUS/TDAP  06/01/2024  . COLONOSCOPY  01/17/2025  . INFLUENZA VACCINE  Completed  . COVID-19 Vaccine  Completed  . Hepatitis C Screening  Completed  . PNA vac Low Risk Adult  Completed    Health Maintenance  There are no preventive care reminders to display for this patient.  Colorectal cancer screening: Type of screening: Colonoscopy. Completed 01/18/2015. Repeat every 10 years  Mammogram status: Completed 04/03/2018. Repeat every year  Bone Density status: Completed 05/14/2018. Results reflect: Bone density results: OSTEOPENIA. Repeat every 3-5 years.  Lung Cancer Screening: (Low Dose CT Chest recommended if Age 22-80 years, 30 pack-year currently smoking OR have quit w/in  15years.) does not qualify.   Lung Cancer Screening Referral: NA  Additional Screening:  Hepatitis C Screening: does not qualify; Completed - YEs  Vision Screening: Recommended annual ophthalmology exams for early detection of glaucoma and other disorders of the eye. Dental Screening: Recommended annual dental exams for proper oral hygiene  Community Resource Referral / Chronic Care Management: CRR required this visit?  No   CCM required this visit?  No      Plan:     I have personally reviewed and noted the following in the patient's chart:   . Medical and social history . Use of alcohol, tobacco or illicit drugs  . Current medications and supplements . Functional ability and status . Nutritional status . Physical activity . Advanced directives . List of other physicians . Hospitalizations, surgeries, and ER visits in previous 12 months . Vitals . Screenings to include cognitive, depression, and falls . Referrals and appointments  In addition, I have reviewed and discussed with patient certain preventive protocols, quality metrics, and best practice recommendations. A written personalized care plan for preventive services as well as general preventive health recommendations were provided to patient.     Nani Gasser, MD   02/28/2020   Nurse Notes:

## 2020-03-10 DIAGNOSIS — Z23 Encounter for immunization: Secondary | ICD-10-CM | POA: Diagnosis not present

## 2020-04-13 ENCOUNTER — Ambulatory Visit (INDEPENDENT_AMBULATORY_CARE_PROVIDER_SITE_OTHER): Payer: Medicare Other | Admitting: Family Medicine

## 2020-04-13 ENCOUNTER — Other Ambulatory Visit: Payer: Self-pay

## 2020-04-13 ENCOUNTER — Other Ambulatory Visit (HOSPITAL_COMMUNITY)
Admission: RE | Admit: 2020-04-13 | Discharge: 2020-04-13 | Disposition: A | Discharge status: Home / Self Care | Payer: Medicare Other | Source: Ambulatory Visit | Attending: Family Medicine | Admitting: Family Medicine

## 2020-04-13 ENCOUNTER — Encounter: Payer: Self-pay | Admitting: Family Medicine

## 2020-04-13 VITALS — BP 136/84 | HR 83 | Ht 65.0 in

## 2020-04-13 DIAGNOSIS — Z124 Encounter for screening for malignant neoplasm of cervix: Secondary | ICD-10-CM

## 2020-04-13 DIAGNOSIS — Z1151 Encounter for screening for human papillomavirus (HPV): Secondary | ICD-10-CM | POA: Insufficient documentation

## 2020-04-13 DIAGNOSIS — R1310 Dysphagia, unspecified: Secondary | ICD-10-CM | POA: Diagnosis not present

## 2020-04-13 DIAGNOSIS — J029 Acute pharyngitis, unspecified: Secondary | ICD-10-CM

## 2020-04-13 NOTE — Patient Instructions (Addendum)
Recommend a trial of Prevacid either taken 20 minutes before first meal of the day or at bedtime.  Recommend continue for 14 days as well as avoiding acidic, greasy, spicy foods.  Avoiding caffeine and carbonated beverages.  If symptoms are not improved at the end of the 14 days then please let us know and the plan will be to refer to GI for further work-up.     Food Choices for Gastroesophageal Reflux Disease, Adult When you have gastroesophageal reflux disease (GERD), the foods you eat and your eating habits are very important. Choosing the right foods can help ease the discomfort of GERD. Consider working with a dietitian to help you make healthy food choices. What are tips for following this plan? Reading food labels  Look for foods that are low in saturated fat. Foods that have less than 5% of daily value (DV) of fat and 0 g of trans fats may help with your symptoms. Cooking  Cook foods using methods other than frying. This may include baking, steaming, grilling, or broiling. These are all methods that do not need a lot of fat for cooking.  To add flavor, try to use herbs that are low in spice and acidity. Meal planning  Choose healthy foods that are low in fat, such as fruits, vegetables, whole grains, low-fat dairy products, lean meats, fish, and poultry.  Eat frequent, small meals instead of three large meals each day. Eat your meals slowly, in a relaxed setting. Avoid bending over or lying down until 2-3 hours after eating.  Limit high-fat foods such as fatty meats or fried foods.  Limit your intake of fatty foods, such as oils, butter, and shortening.  Avoid the following as told by your health care provider: ? Foods that cause symptoms. These may be different for different people. Keep a food diary to keep track of foods that cause symptoms. ? Alcohol. ? Drinking large amounts of liquid with meals. ? Eating meals during the 2-3 hours before bed.   Lifestyle  Maintain a  healthy weight. Ask your health care provider what weight is healthy for you. If you need to lose weight, work with your health care provider to do so safely.  Exercise for at least 30 minutes on 5 or more days each week, or as told by your health care provider.  Avoid wearing clothes that fit tightly around your waist and chest.  Do not use any products that contain nicotine or tobacco. These products include cigarettes, chewing tobacco, and vaping devices, such as e-cigarettes. If you need help quitting, ask your health care provider.  Sleep with the head of your bed raised. Use a wedge under the mattress or blocks under the bed frame to raise the head of the bed.  Chew sugar-free gum after mealtimes. What foods should I eat? Eat a healthy, well-balanced diet of fruits, vegetables, whole grains, low-fat dairy products, lean meats, fish, and poultry. Each person is different. Foods that may trigger symptoms in one person may not trigger any symptoms in another person. Work with your health care provider to identify foods that are safe for you. The items listed above may not be a complete list of recommended foods and beverages. Contact a dietitian for more information.   What foods should I avoid? Limiting some of these foods may help manage the symptoms of GERD. Everyone is different. Consult a dietitian or your health care provider to help you identify the exact foods to avoid, if any. Fruits  Any fruits prepared with added fat. Any fruits that cause symptoms. For some people this may include citrus fruits, such as oranges, grapefruit, pineapple, and lemons. Vegetables Deep-fried vegetables. [place] fries. Any vegetables prepared with added fat. Any vegetables that cause symptoms. For some people, this may include tomatoes and tomato products, chili peppers, onions and garlic, and horseradish. Grains Pastries or quick breads with added fat. Meats and other proteins High-fat meats, such as  fatty beef or pork, hot dogs, ribs, ham, sausage, salami, and bacon. Fried meat or protein, including fried fish and fried chicken. Nuts and nut butters, in large amounts. Dairy Whole milk and chocolate milk. Sour cream. Cream. Ice cream. Cream cheese. Milkshakes. Fats and oils Butter. Margarine. Shortening. Ghee. Beverages Coffee and tea, with or without caffeine. Carbonated beverages. Sodas. Energy drinks. Fruit juice made with acidic fruits, such as orange or grapefruit. Tomato juice. Alcoholic drinks. Sweets and desserts Chocolate and cocoa. Donuts. Seasonings and condiments Pepper. Peppermint and spearmint. Added salt. Any condiments, herbs, or seasonings that cause symptoms. For some people, this may include curry, hot sauce, or vinegar-based salad dressings. The items listed above may not be a complete list of foods and beverages to avoid. Contact a dietitian for more information. Questions to ask your health care provider Diet and lifestyle changes are usually the first steps that are taken to manage symptoms of GERD. If diet and lifestyle changes do not improve your symptoms, talk with your health care provider about taking medicines. Where to find more information  International Foundation for Gastrointestinal Disorders: aboutgerd.org Summary  When you have gastroesophageal reflux disease (GERD), food and lifestyle choices may be very helpful in easing the discomfort of GERD.  Eat frequent, small meals instead of three large meals each day. Eat your meals slowly, in a relaxed setting. Avoid bending over or lying down until 2-3 hours after eating.  Limit high-fat foods such as fatty meats or fried foods. This information is not intended to replace advice given to you by your health care provider. Make sure you discuss any questions you have with your health care provider. Document Revised: 09/20/2019 Document Reviewed: 09/20/2019 Elsevier Patient Education  2021 [employer].

## 2020-04-13 NOTE — Progress Notes (Signed)
Established Patient Office Visit  Subjective:  Patient ID: Amanda Moyer, female    DOB: [birth year]  Age: 70 y.o. MRN: [id number]  CC:  Chief Complaint  Patient presents with  . Gynecologic Exam  . Gastroesophageal Reflux    x3wks    HPI Amanda Moyer presents for pelvic exam.  She had a benign polyp removed off the cervix in 2018.  She has not had a follow-up pelvic exam since then so she is here today for that she has not had any problems.   She also complains of a sore throat that is been going on for a couple weeks she said initially it started on the right side and now it is a little bit more on the left.  She COVID tested twice and she was negative she never experienced any fevers chills cough or other problems.  But she has been having some burning in her upper chest she wonders if it could be her reflux.  She was on a PPI at one point in time but no longer takes it.  Also reports that occasionally has noticed food getting stuck in her upper chest when she swallows she said her brother had similar problems and had to have a dilatation of his esophagus.  She says it happens rarely.  Past Medical History:  Diagnosis Date  . Retinal detachment    both eyes     Past Surgical History:  Procedure Laterality Date  . arm fracture  1966   no hardware  . CHOLECYSTECTOMY  1990  . RETINAL DETACHMENT SURGERY Left 1996  . TONSILLECTOMY  1960    Family History  Problem Relation Age of Onset  . Prostate cancer Paternal Grandfather   . Depression Maternal Grandmother   . Hypertension Mother   . Alzheimer's disease Mother   . Parkinson's disease Mother   . Hypertension Sister   . Graves' disease Daughter   . Raynaud syndrome Daughter     Social History   Socioeconomic History  . Marital status: Married    Spouse name: [name]  . Number of children: 2  . Years of education: BS Sociolo  . Highest education level: Not on file  Occupational History  . Occupation:  Homemaker  Tobacco Use  . Smoking status: Never Smoker  . Smokeless tobacco: Never Used  Substance and Sexual Activity  . Alcohol use: Yes    Alcohol/week: 2.0 standard drinks    Types: 2 Glasses of wine per week  . Drug use: No  . Sexual activity: Yes    Partners: Male    Birth control/protection: None  Other Topics Concern  . Not on file  Social History Narrative   Exercises regularly by walking for 40-6- minutes 3 times a week. 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 She is married to [place] and has 2 adult children.      Social Determinants of Health   Financial Resource Strain: Not on file  Food Insecurity: Not on file  Transportation Needs: Not on file  Physical Activity: Not on file  Stress: Not on file  Social Connections: Not on file  Intimate Partner Violence: Not on file    Outpatient Medications Prior to Visit  Medication Sig Dispense Refill  . BIOTIN PO Take by mouth.    . cholecalciferol (VITAMIN D) 1000 units tablet Take 1,000 Units by mouth daily.    . DiphenhydrAMINE HCl (BENADRYL ALLERGY PO) Take by mouth as needed.    Marland Kitchen  fluticasone (FLONASE) 50 MCG/ACT nasal spray Place 1 spray into both nostrils 2 (two) times daily.    Marland Kitchen linaclotide (LINZESS) 290 MCG CAPS capsule Take 1 capsule (290 mcg total) by mouth daily before breakfast. 90 capsule 1  . loratadine (CLARITIN) 10 MG tablet Take 10 mg by mouth daily.    Marland Kitchen MAGNESIUM PO Take by mouth.    . valsartan-hydrochlorothiazide (DIOVAN-HCT) 160-25 MG tablet TAKE 1 TABLET EVERY DAY 90 tablet 1  . VENTOLIN HFA 108 (90 Base) MCG/ACT inhaler INHALE 2 PUFFS INTO THE LUNGS EVERY 6 HOURS AS NEEDED FOR SHORTNESS OF BREATH 18 g 1   No facility-administered medications prior to visit.    Allergies  Allergen Reactions  . Irbesartan-Hydrochlorothiazide     Other reaction(s): Other (See Comments) Elevated blood pressure.  Marland Kitchen Percodan [Oxycodone-Aspirin] Nausea Only    ROS Review of Systems    Objective:     Physical Exam Exam conducted with a chaperone present.  Constitutional:      Appearance: She is well-developed.  HENT:     Head: Normocephalic and atraumatic.     Right Ear: Tympanic membrane, ear canal and external ear normal.     Left Ear: Tympanic membrane, ear canal and external ear normal.     Nose: Nose normal.     Mouth/Throat:     Mouth: Mucous membranes are moist.     Pharynx: Oropharynx is clear. No oropharyngeal exudate or posterior oropharyngeal erythema.  Eyes:     Conjunctiva/sclera: Conjunctivae normal.     Pupils: Pupils are equal, round, and reactive to light.  Neck:     Thyroid: No thyromegaly.  Cardiovascular:     Rate and Rhythm: Normal rate and regular rhythm.     Heart sounds: Normal heart sounds.  Pulmonary:     Effort: Pulmonary effort is normal.     Breath sounds: Normal breath sounds. No wheezing.  Genitourinary:    General: Normal vulva.     Pubic Area: No rash.      Vagina: Normal.     Cervix: Normal.     Uterus: Normal.   Musculoskeletal:     Cervical back: Neck supple.  Lymphadenopathy:     Cervical: No cervical adenopathy.  Skin:    General: Skin is warm and dry.  Neurological:     Mental Status: She is alert and oriented to person, place, and time.     BP 136/84   Pulse 83   Ht 5\' 5"  (1.651 m)   SpO2 100%   BMI 26.79 kg/m  Wt Readings from Last 3 Encounters:  06/07/19 161 lb (73 kg)  05/01/18 158 lb (71.7 kg)  12/11/17 155 lb (70.3 kg)     Health Maintenance Due  Topic Date Due  . MAMMOGRAM  04/03/2020    There are no preventive care reminders to display for this patient.  Lab Results  Component Value Date   TSH 1.44 09/18/2016   Lab Results  Component Value Date   WBC 8.3 02/28/2020   HGB 15.2 02/28/2020   HCT 44.1 02/28/2020   MCV 89.6 02/28/2020   PLT 298 02/28/2020   Lab Results  Component Value Date   NA 136 02/28/2020   K 3.7 02/28/2020   CO2 30 02/28/2020   GLUCOSE 98 02/28/2020   BUN 13 02/28/2020    CREATININE 0.81 02/28/2020   BILITOT 0.9 02/28/2020   ALKPHOS 49 09/18/2016   AST 20 02/28/2020   ALT 17 02/28/2020   PROT 7.2  02/28/2020   ALBUMIN 4.7 09/18/2016   CALCIUM 9.9 02/28/2020   Lab Results  Component Value Date   CHOL 208 (H) 02/28/2020   Lab Results  Component Value Date   HDL 80 02/28/2020   Lab Results  Component Value Date   LDLCALC 104 (H) 02/28/2020   Lab Results  Component Value Date   TRIG 141 02/28/2020   Lab Results  Component Value Date   CHOLHDL 2.6 02/28/2020   No results found for: HGBA1C    Assessment & Plan:   Problem List Items Addressed This Visit   None   Visit Diagnoses    Cervical cancer screening    -  Primary   Relevant Orders   Cytology - PAP   Sore throat       Dysphagia, unspecified type         Cervical cancer screening performed.  No recurrence of the polyp at least on exam.  Will call with results once available if negative then she should need no further Pap smears in the future.  Sore throat-most consistent with GERD.  Recommend restarting a PPI.  As well as reviewing GERD dietary measures.  If not significantly improved in 14 days then please let us know and we will refer to GI for further work-up.  Dysphagia -gave her warning signs and symptoms to monitor for and if at any point she feels like it is becoming more frequent to be happy to refer her to GI for further evaluation with endoscopy.  No orders of the defined types were placed in this encounter.   Follow-up: No follow-ups on file.    Nani Gasser, MD

## 2020-04-14 LAB — CYTOLOGY - PAP
Comment: NEGATIVE
Diagnosis: NEGATIVE
High risk HPV: NEGATIVE

## 2020-04-14 NOTE — Progress Notes (Signed)
Call patient: Your Pap smear is normal.

## 2020-05-10 ENCOUNTER — Telehealth: Payer: Self-pay | Admitting: Family Medicine

## 2020-05-10 NOTE — Telephone Encounter (Signed)
Pt left a voicemail for a refill on her Linzess . Please call patient and let her know when this is complete

## 2020-05-11 NOTE — Telephone Encounter (Signed)
LVM advising pt to contact her pharmacy this was sent 02/28/2020 #[phone] to [facility] on [address]

## 2020-06-14 DIAGNOSIS — Z1231 Encounter for screening mammogram for malignant neoplasm of breast: Secondary | ICD-10-CM | POA: Diagnosis not present

## 2020-06-14 LAB — HM MAMMOGRAPHY

## 2020-06-21 ENCOUNTER — Encounter: Payer: Self-pay | Admitting: Family Medicine

## 2020-11-27 IMAGING — DX DG HAND 2V*R*
2 series · 2 of 2 positions shown · non-contrast
Comparison: None.

CLINICAL DATA: Chronic bilateral hand swelling.

EXAM:
RIGHT HAND - 2 VIEW; LEFT HAND - 2 VIEW

[hand pa]
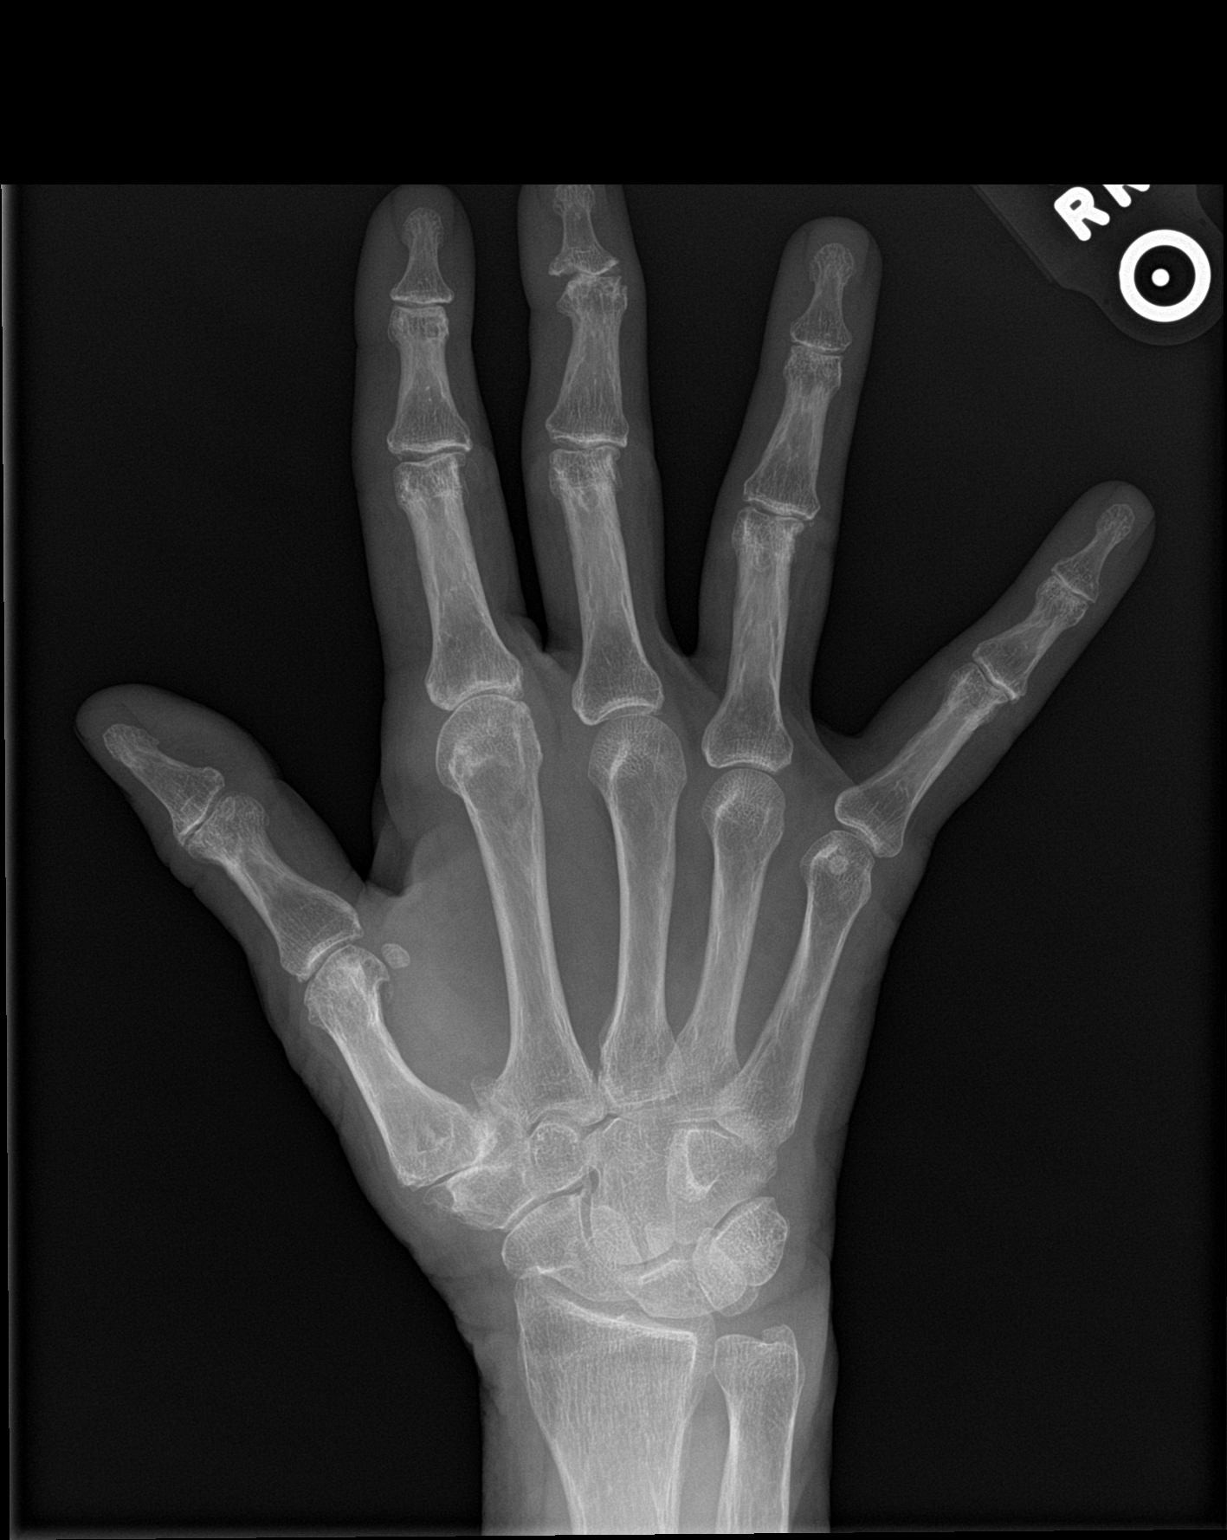

[hand lat]
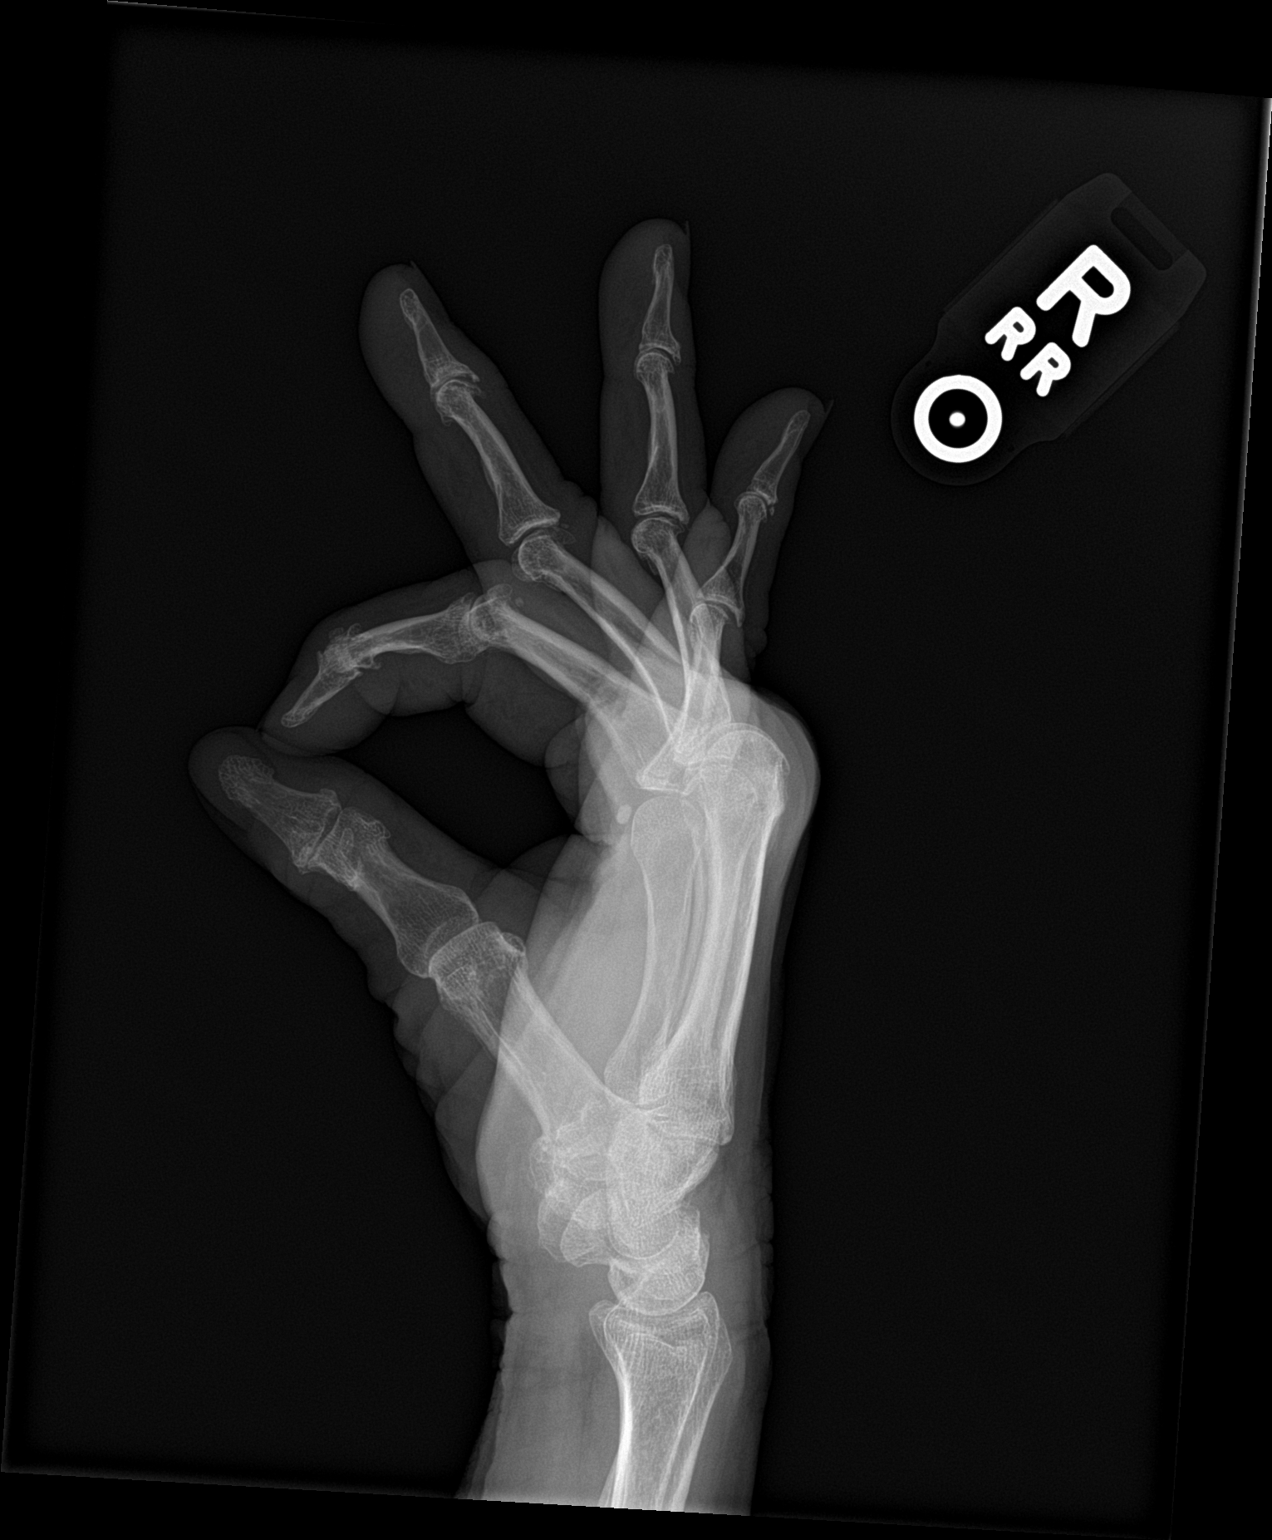

[2 of 2 positions shown; findings below may reference images not displayed]

FINDINGS: Periarticular osteopenia noted.

DIP and PIP joint degenerative changes involving both hands and most
notably the second and third digits of the left hand and the third
digit of the right hand. There are erosive changes also. Findings
suggest erosive OA.

The MCP joints are maintained.  No erosive changes.

Moderate to advanced degenerative changes involving the CMC joints
of both thumbs.

The radiocarpal and intercarpal joint spaces are maintained. No
erosive changes or chondrocalcinosis.
IMPRESSION: Advanced degenerative changes and erosive findings involving the DIP
and PIP joints of the fingers suggesting erosive OA.

Moderate degenerative changes at the CMC joints of both thumbs.

No findings for erosive arthropathy involving the MCP joints or
wrist joints.

## 2020-11-27 IMAGING — DX DG HAND 2V*L*
2 series · 2 of 2 positions shown · non-contrast
Comparison: None.

CLINICAL DATA: Chronic bilateral hand swelling.

EXAM:
RIGHT HAND - 2 VIEW; LEFT HAND - 2 VIEW

[hand pa]
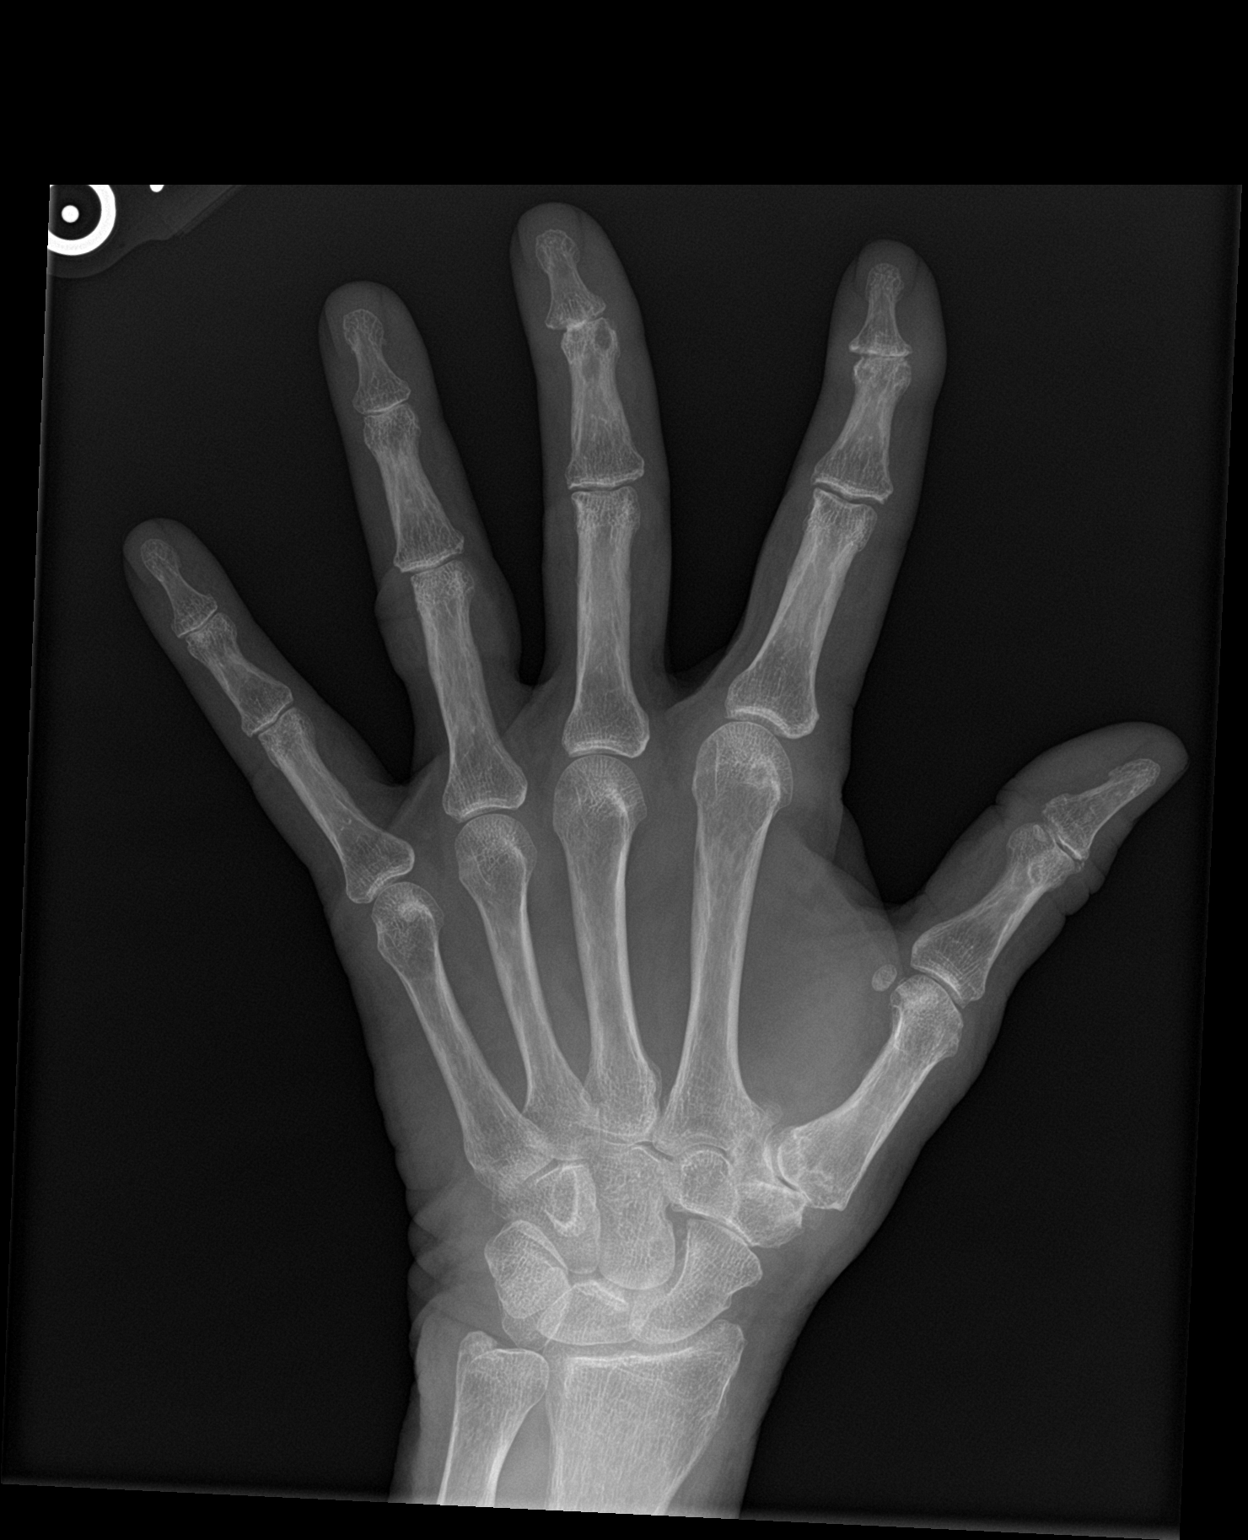

[hand lat]
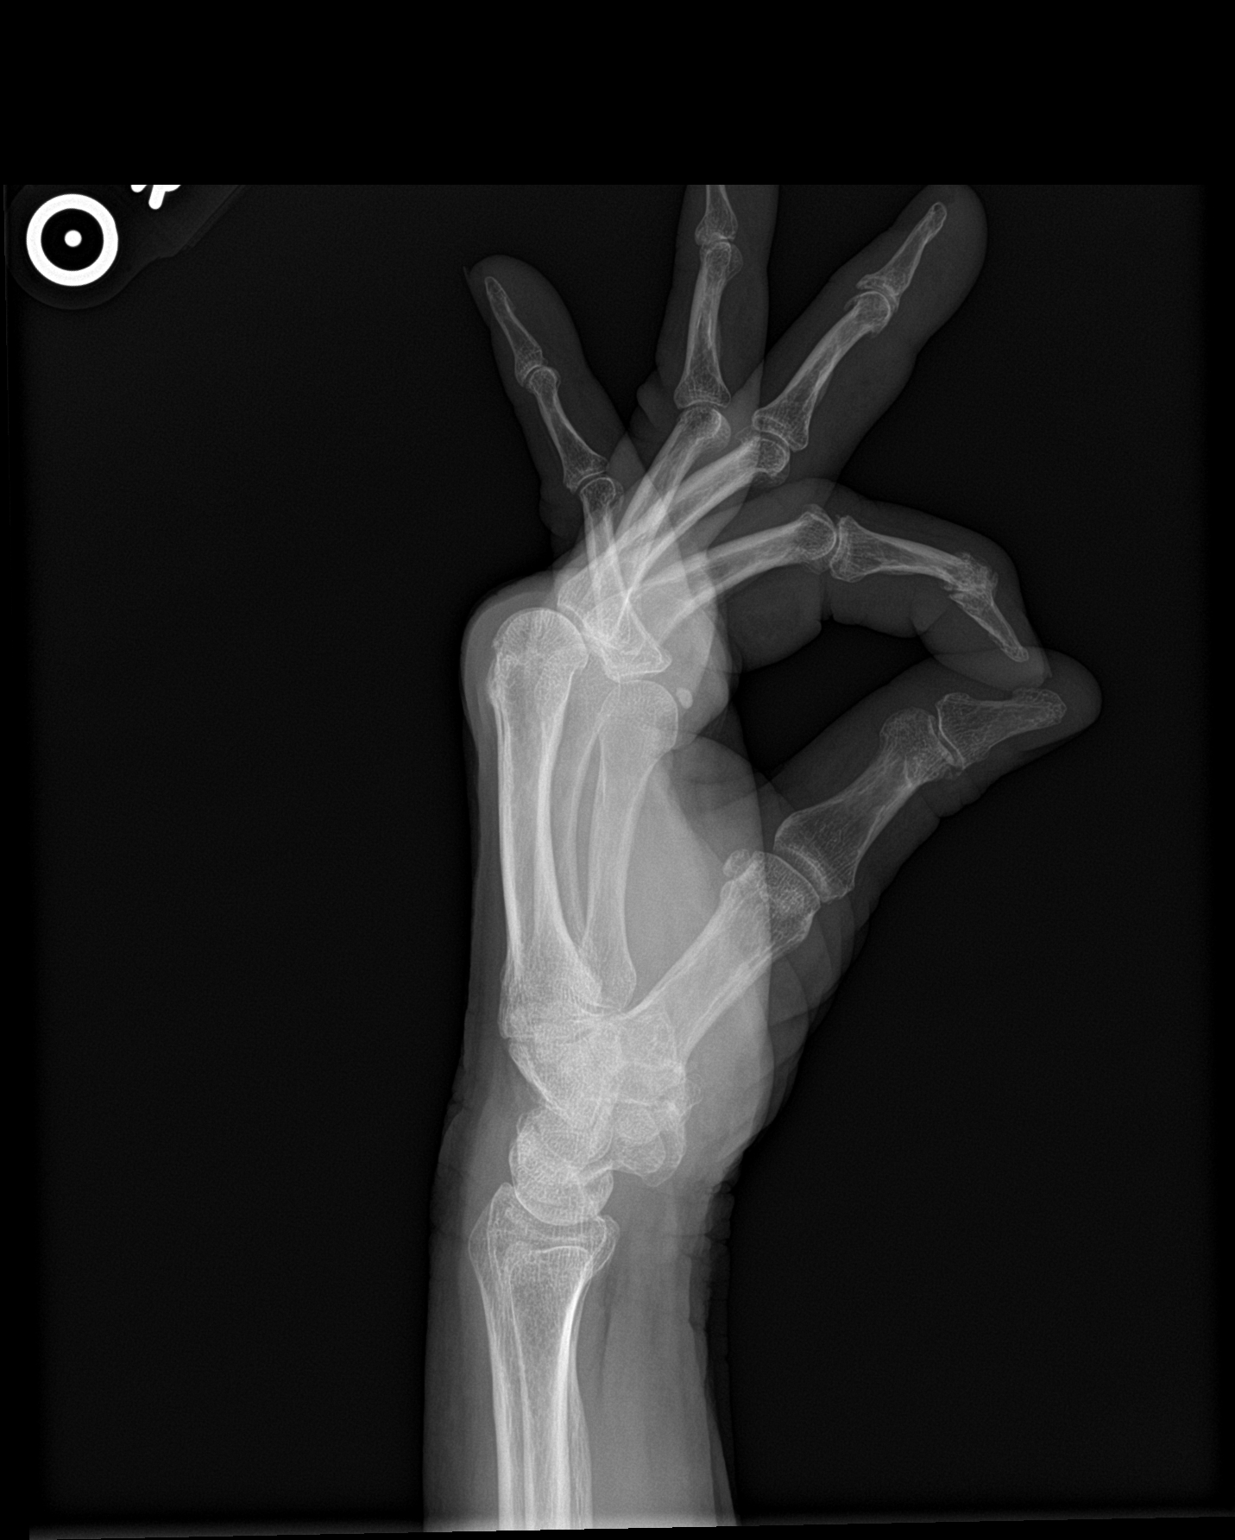

[2 of 2 positions shown; findings below may reference images not displayed]

FINDINGS: Periarticular osteopenia noted.

DIP and PIP joint degenerative changes involving both hands and most
notably the second and third digits of the left hand and the third
digit of the right hand. There are erosive changes also. Findings
suggest erosive OA.

The MCP joints are maintained.  No erosive changes.

Moderate to advanced degenerative changes involving the CMC joints
of both thumbs.

The radiocarpal and intercarpal joint spaces are maintained. No
erosive changes or chondrocalcinosis.
IMPRESSION: Advanced degenerative changes and erosive findings involving the DIP
and PIP joints of the fingers suggesting erosive OA.

Moderate degenerative changes at the CMC joints of both thumbs.

No findings for erosive arthropathy involving the MCP joints or
wrist joints.

## 2020-12-29 ENCOUNTER — Other Ambulatory Visit: Payer: Self-pay | Admitting: Family Medicine

## 2020-12-29 DIAGNOSIS — M19049 Primary osteoarthritis, unspecified hand: Secondary | ICD-10-CM

## 2020-12-29 DIAGNOSIS — I10 Essential (primary) hypertension: Secondary | ICD-10-CM

## 2020-12-29 NOTE — Telephone Encounter (Signed)
Left VM for patient to call back to get this f/u for bp & labs scheduled for any further refills. AM

## 2020-12-29 NOTE — Telephone Encounter (Signed)
Please call pt and inform her that she will need to schedule a f/u for bp and have labs done for refills. 30 day supply sent for now.

## 2021-01-27 ENCOUNTER — Other Ambulatory Visit: Payer: Self-pay | Admitting: Family Medicine

## 2021-01-27 DIAGNOSIS — I10 Essential (primary) hypertension: Secondary | ICD-10-CM

## 2021-01-27 DIAGNOSIS — M19049 Primary osteoarthritis, unspecified hand: Secondary | ICD-10-CM

## 2021-02-06 ENCOUNTER — Other Ambulatory Visit: Payer: Self-pay | Admitting: Family Medicine

## 2021-02-06 DIAGNOSIS — M19049 Primary osteoarthritis, unspecified hand: Secondary | ICD-10-CM

## 2021-02-06 DIAGNOSIS — I10 Essential (primary) hypertension: Secondary | ICD-10-CM

## 2021-02-06 NOTE — Telephone Encounter (Signed)
LVM for patient to call back to get this f/u appt scheduled. AM 

## 2021-02-06 NOTE — Telephone Encounter (Signed)
Please call pt to schedule appt.  No further refills until pt is seen.  T. Dawn Convery, CMA  

## 2021-02-07 DIAGNOSIS — Z23 Encounter for immunization: Secondary | ICD-10-CM | POA: Diagnosis not present

## 2021-02-20 ENCOUNTER — Encounter: Payer: Self-pay | Admitting: Family Medicine

## 2021-02-20 ENCOUNTER — Other Ambulatory Visit: Payer: Self-pay

## 2021-02-20 ENCOUNTER — Ambulatory Visit (INDEPENDENT_AMBULATORY_CARE_PROVIDER_SITE_OTHER): Payer: Medicare Other | Admitting: Family Medicine

## 2021-02-20 VITALS — BP 133/95 | HR 79 | Ht 65.0 in | Wt 161.0 lb

## 2021-02-20 DIAGNOSIS — I10 Essential (primary) hypertension: Secondary | ICD-10-CM

## 2021-02-20 DIAGNOSIS — M545 Low back pain, unspecified: Secondary | ICD-10-CM | POA: Diagnosis not present

## 2021-02-20 LAB — COMPLETE METABOLIC PANEL WITH GFR
AG Ratio: 1.8 (calc) (ref 1.0–2.5)
ALT: 19 U/L (ref 6–29)
AST: 21 U/L (ref 10–35)
Albumin: 4.6 g/dL (ref 3.6–5.1)
Alkaline phosphatase (APISO): 52 U/L (ref 37–153)
BUN: 11 mg/dL (ref 7–25)
CO2: 28 mmol/L (ref 20–32)
Calcium: 10.1 mg/dL (ref 8.6–10.4)
Chloride: 96 mmol/L — ABNORMAL LOW (ref 98–110)
Creat: 0.85 mg/dL (ref 0.50–1.05)
Globulin: 2.6 g/dL (calc) (ref 1.9–3.7)
Glucose, Bld: 97 mg/dL (ref 65–139)
Potassium: 3.8 mmol/L (ref 3.5–5.3)
Sodium: 134 mmol/L — ABNORMAL LOW (ref 135–146)
Total Bilirubin: 0.7 mg/dL (ref 0.2–1.2)
Total Protein: 7.2 g/dL (ref 6.1–8.1)
eGFR: 75 mL/min/{1.73_m2} (ref >=60)

## 2021-02-20 LAB — CBC
HCT: 43.6 % (ref 35.0–45.0)
Hemoglobin: 15.1 g/dL (ref 11.7–15.5)
MCH: 31.1 pg (ref 27.0–33.0)
MCHC: 34.6 g/dL (ref 32.0–36.0)
MCV: 89.7 fL (ref 80.0–100.0)
MPV: 10.6 fL (ref 7.5–12.5)
Platelets: 298 10*3/uL (ref 140–400)
RBC: 4.86 10*6/uL (ref 3.80–5.10)
RDW: 11.9 % (ref 11.0–15.0)
WBC: 6.3 10*3/uL (ref 3.8–10.8)

## 2021-02-20 LAB — LIPID PANEL W/REFLEX DIRECT LDL
Cholesterol: 216 mg/dL — ABNORMAL HIGH (ref <200)
HDL: 81 mg/dL (ref >=50)
LDL Cholesterol (Calc): 112 mg/dL (calc) — ABNORMAL HIGH
Non-HDL Cholesterol (Calc): 135 mg/dL (calc) — ABNORMAL HIGH (ref <130)
Total CHOL/HDL Ratio: 2.7 (calc) (ref <5.0)
Triglycerides: 120 mg/dL (ref <150)

## 2021-02-20 MED ORDER — VALSARTAN-HYDROCHLOROTHIAZIDE 160-25 MG PO TABS: 1.0000 | ORAL_TABLET | Freq: Every day | ORAL | 0 refills | Status: DC

## 2021-02-20 MED ORDER — VALSARTAN-HYDROCHLOROTHIAZIDE 320-25 MG PO TABS: 1.0000 | ORAL_TABLET | Freq: Every day | ORAL | 0 refills | Status: DC

## 2021-02-20 NOTE — Assessment & Plan Note (Signed)
Are still little elevated today in the past its been mostly in the 130s we discussed getting her on home blood pressure machine so that she can track it over the next couple of weeks and then come back in for nurse visit and bring her machine with her to compare to make sure that her blood pressures are better at home if not and we do have some room to adjust her valsartan HCTZ.  She is taking her medication regularly.

## 2021-02-20 NOTE — Progress Notes (Addendum)
Established Patient Office Visit  Subjective:  Patient ID: Amanda Moyer, female    DOB: 1950/08/08  Age: 70 y.o. MRN: [id number]  CC:  Chief Complaint  Patient presents with   Hypertension    HPI Amanda Moyer presents for   Hypertension- Pt denies chest pain, SOB, dizziness, or heart palpitations.  Taking meds as directed w/o problems.  Denies medication side effects.  Last couple time she has been here her blood pressure has been in the 130s today in the 140s.  She tolerates her medication well and has had not any problems or side effects.    She does have some arthritis in her low back that does bother her from time to time.  This to the time she does not have to take anything for it.   Past Medical History:  Diagnosis Date   Retinal detachment    both eyes     Past Surgical History:  Procedure Laterality Date   arm fracture  1966   no hardware   CHOLECYSTECTOMY  1990   RETINAL DETACHMENT SURGERY Left 1996   TONSILLECTOMY  1960    Family History  Problem Relation Age of Onset   Prostate cancer Paternal Grandfather    Depression Maternal Grandmother    Hypertension Mother    Alzheimer's disease Mother    Parkinson's disease Mother    Hypertension Sister    Graves' disease Daughter    Raynaud syndrome Daughter     Social History   Socioeconomic History   Marital status: Married    Spouse name: Amanda Moyer   Number of children: 2   Years of education: BS Sociolo   Highest education level: Not on file  Occupational History   Occupation: Homemaker  Tobacco Use   Smoking status: Never   Smokeless tobacco: Never  Substance and Sexual Activity   Alcohol use: Yes    Alcohol/week: 2.0 standard drinks    Types: 2 Glasses of wine per week   Drug use: No   Sexual activity: Yes    Partners: Male    Birth control/protection: None  Other Topics Concern   Not on file  Social History Narrative   Exercises regularly by walking for 40-6- minutes 3 times a week.  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 She is married to Amanda Moyer and has 2 adult children.      Social Determinants of Health   Financial Resource Strain: Not on file  Food Insecurity: Not on file  Transportation Needs: Not on file  Physical Activity: Not on file  Stress: Not on file  Social Connections: Not on file  Intimate Partner Violence: Not on file    Outpatient Medications Prior to Visit  Medication Sig Dispense Refill   BIOTIN PO Take by mouth.     cholecalciferol (VITAMIN D) 1000 units tablet Take 1,000 Units by mouth daily.     DiphenhydrAMINE HCl (BENADRYL ALLERGY PO) Take by mouth as needed.     fluticasone (FLONASE) 50 MCG/ACT nasal spray Place 1 spray into both nostrils 2 (two) times daily.     linaclotide (LINZESS) 290 MCG CAPS capsule Take 1 capsule (290 mcg total) by mouth daily before breakfast. 90 capsule 1   loratadine (CLARITIN) 10 MG tablet Take 10 mg by mouth daily.     MAGNESIUM PO Take by mouth.     VENTOLIN HFA 108 (90 Base) MCG/ACT inhaler INHALE 2 PUFFS INTO THE LUNGS EVERY 6 HOURS AS NEEDED FOR SHORTNESS OF  BREATH 18 g 1   valsartan-hydrochlorothiazide (DIOVAN-HCT) 160-25 MG tablet TAKE 1 TABLET BY MOUTH DAILY. 30 DAY SUPPLY GIVEN. PLEASE CALL OFFICE TO SCHEDULE AN APPOINTMENT FOR REFILLS ON THIS MEDICATION. 15 tablet 0   No facility-administered medications prior to visit.    Allergies  Allergen Reactions   Irbesartan-Hydrochlorothiazide     Other reaction(s): Other (See Comments) Elevated blood pressure.   Zostavax [Zoster Vaccine Live] Other (See Comments)    Caused joint pain    ROS Review of Systems    Objective:    Physical Exam Constitutional:      Appearance: Normal appearance. She is well-developed.  HENT:     Head: Normocephalic and atraumatic.  Cardiovascular:     Rate and Rhythm: Normal rate and regular rhythm.     Heart sounds: Normal heart sounds.  Pulmonary:     Effort: Pulmonary effort is normal.     Breath  sounds: Normal breath sounds.  Skin:    General: Skin is warm and dry.  Neurological:     Mental Status: She is alert and oriented to person, place, and time.  Psychiatric:        Behavior: Behavior normal.    BP (!) 133/95   Pulse 79   Ht 5\' 5"  (1.651 m)   Wt 161 lb (73 kg)   SpO2 99%   BMI 26.79 kg/m  Wt Readings from Last 3 Encounters:  02/20/21 161 lb (73 kg)  06/07/19 161 lb (73 kg)  05/01/18 158 lb (71.7 kg)     Health Maintenance Due  Topic Date Due   COVID-19 Vaccine (5 - Booster for Moderna series) 01/25/2021    There are no preventive care reminders to display for this patient.  Lab Results  Component Value Date   TSH 1.44 09/18/2016   Lab Results  Component Value Date   WBC 8.3 02/28/2020   HGB 15.2 02/28/2020   HCT 44.1 02/28/2020   MCV 89.6 02/28/2020   PLT 298 02/28/2020   Lab Results  Component Value Date   NA 136 02/28/2020   K 3.7 02/28/2020   CO2 30 02/28/2020   GLUCOSE 98 02/28/2020   BUN 13 02/28/2020   CREATININE 0.81 02/28/2020   BILITOT 0.9 02/28/2020   ALKPHOS 49 09/18/2016   AST 20 02/28/2020   ALT 17 02/28/2020   PROT 7.2 02/28/2020   ALBUMIN 4.7 09/18/2016   CALCIUM 9.9 02/28/2020   Lab Results  Component Value Date   CHOL 208 (H) 02/28/2020   Lab Results  Component Value Date   HDL 80 02/28/2020   Lab Results  Component Value Date   LDLCALC 104 (H) 02/28/2020   Lab Results  Component Value Date   TRIG 141 02/28/2020   Lab Results  Component Value Date   CHOLHDL 2.6 02/28/2020   No results found for: HGBA1C    Assessment & Plan:   Problem List Items Addressed This Visit       Cardiovascular and Mediastinum   Essential hypertension - Primary    Are still little elevated today in the past its been mostly in the 130s we discussed getting her on home blood pressure machine so that she can track it over the next couple of weeks and then come back in for nurse visit and bring her machine with her to compare  to make sure that her blood pressures are better at home if not and we do have some room to adjust her valsartan HCTZ.  She  is taking her medication regularly.      Relevant Medications   valsartan-hydrochlorothiazide (DIOVAN-HCT) 160-25 MG tablet   Other Relevant Orders   Lipid Panel w/reflex Direct LDL   COMPLETE METABOLIC PANEL WITH GFR   CBC     Other   Low back pain    Meds ordered this encounter  Medications   DISCONTD: valsartan-hydrochlorothiazide (DIOVAN-HCT) 320-25 MG tablet    Sig: Take 1 tablet by mouth daily.    Dispense:  30 tablet    Refill:  0   valsartan-hydrochlorothiazide (DIOVAN-HCT) 160-25 MG tablet    Sig: Take 1 tablet by mouth daily. Pls d/c order for the Diovan hct 320    Dispense:  30 tablet    Refill:  0     Follow-up: Return in about 2 weeks (around 03/06/2021) for Nurse visit for BP, bring home cuff and numbers.    Nani Gasser, MD

## 2021-02-21 NOTE — Progress Notes (Signed)
Hi Amanda Moyer, your LDL cholesterol and total cholesterol are a little elevated.  You have brought it down last year but it is back up again just encourage you to continue to work on healthy diet and regular exercise.  Your sodium is a little borderline low only off by one-point so not worrisome or concerning.  Liver enzymes are normal.  Blood count is normal.

## 2021-03-06 ENCOUNTER — Other Ambulatory Visit: Payer: Self-pay | Admitting: Family Medicine

## 2021-03-06 DIAGNOSIS — I10 Essential (primary) hypertension: Secondary | ICD-10-CM

## 2021-03-08 ENCOUNTER — Ambulatory Visit: Payer: Medicare Other

## 2021-03-08 ENCOUNTER — Other Ambulatory Visit: Payer: Self-pay

## 2021-03-08 ENCOUNTER — Ambulatory Visit (INDEPENDENT_AMBULATORY_CARE_PROVIDER_SITE_OTHER): Payer: Medicare Other | Admitting: Family Medicine

## 2021-03-08 ENCOUNTER — Encounter: Payer: Self-pay | Admitting: Family Medicine

## 2021-03-08 VITALS — BP 134/93 | HR 82 | Temp 98.0°F | Ht 64.5 in | Wt 159.0 lb

## 2021-03-08 DIAGNOSIS — J0140 Acute pansinusitis, unspecified: Secondary | ICD-10-CM | POA: Diagnosis not present

## 2021-03-08 MED ORDER — BENZONATATE 200 MG PO CAPS: 200.0000 mg | ORAL_CAPSULE | Freq: Two times a day (BID) | ORAL | 0 refills | Status: DC | PRN

## 2021-03-08 MED ORDER — AMOXICILLIN-POT CLAVULANATE 875-125 MG PO TABS: 1.0000 | ORAL_TABLET | Freq: Two times a day (BID) | ORAL | 0 refills | Status: AC

## 2021-03-08 NOTE — Progress Notes (Signed)
Acute Office Visit  Subjective:    Patient ID: Amanda Moyer, female    DOB: June 14, 1950, 70 y.o.   MRN: [id number]  Chief Complaint  Patient presents with   Sinusitis    Sinusitis This is a new problem. The current episode started 1 to 4 weeks ago. The problem has been gradually worsening since onset. The maximum temperature recorded prior to her arrival was 101 - 101.9 F. The fever has been present for 1 to 2 days. Her pain is at a severity of 4/10. The pain is moderate. Associated symptoms include congestion, coughing, headaches, shortness of breath and sinus pressure. Pertinent negatives include no sneezing or sore throat. (Mild wheezing at night, ear pressure bilaterally) Past treatments include oral decongestants. The treatment provided mild relief.     Past Medical History:  Diagnosis Date   Retinal detachment    both eyes     Past Surgical History:  Procedure Laterality Date   arm fracture  1966   no hardware   CHOLECYSTECTOMY  1990   RETINAL DETACHMENT SURGERY Left 1996   TONSILLECTOMY  1960    Family History  Problem Relation Age of Onset   Prostate cancer Paternal Grandfather    Depression Maternal Grandmother    Hypertension Mother    Alzheimer's disease Mother    Parkinson's disease Mother    Hypertension Sister    Graves' disease Daughter    Raynaud syndrome Daughter     Social History   Socioeconomic History   Marital status: Married    Spouse name: Lenny Pastel   Number of children: 2   Years of education: BS Sociolo   Highest education level: Not on file  Occupational History   Occupation: Homemaker  Tobacco Use   Smoking status: Never   Smokeless tobacco: Never  Substance and Sexual Activity   Alcohol use: Yes    Alcohol/week: 2.0 standard drinks    Types: 2 Glasses of wine per week   Drug use: No   Sexual activity: Yes    Partners: Male    Birth control/protection: None  Other Topics Concern   Not on file  Social History Narrative    Exercises regularly by walking for 40-6- minutes 3 times a week. 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 She is married to [place] and has 2 adult children.      Social Determinants of Health   Financial Resource Strain: Not on file  Food Insecurity: Not on file  Transportation Needs: Not on file  Physical Activity: Not on file  Stress: Not on file  Social Connections: Not on file  Intimate Partner Violence: Not on file    Outpatient Medications Prior to Visit  Medication Sig Dispense Refill   BIOTIN PO Take by mouth.     cholecalciferol (VITAMIN D) 1000 units tablet Take 1,000 Units by mouth daily.     DiphenhydrAMINE HCl (BENADRYL ALLERGY PO) Take by mouth as needed.     fluticasone (FLONASE) 50 MCG/ACT nasal spray Place 1 spray into both nostrils 2 (two) times daily.     linaclotide (LINZESS) 290 MCG CAPS capsule Take 1 capsule (290 mcg total) by mouth daily before breakfast. 90 capsule 1   loratadine (CLARITIN) 10 MG tablet Take 10 mg by mouth daily.     MAGNESIUM PO Take by mouth.     valsartan-hydrochlorothiazide (DIOVAN-HCT) 160-25 MG tablet TAKE 1 TABLET BY MOUTH DAILY. PLS D/C ORDER FOR THE DIOVAN HCT 320 30 tablet 0  VENTOLIN HFA 108 (90 Base) MCG/ACT inhaler INHALE 2 PUFFS INTO THE LUNGS EVERY 6 HOURS AS NEEDED FOR SHORTNESS OF BREATH 18 g 1   No facility-administered medications prior to visit.    Allergies  Allergen Reactions   Gramineae Pollens    Irbesartan-Hydrochlorothiazide     Other reaction(s): Other (See Comments) Elevated blood pressure.   Zostavax [Zoster Vaccine Live] Other (See Comments)    Caused joint pain    Review of Systems All review of systems negative except what is listed in the HPI     Objective:    Physical Exam Vitals reviewed.  Constitutional:      Appearance: Normal appearance.  HENT:     Head: Normocephalic and atraumatic.     Comments: Bilateral maxillary sinus tenderness to palpation    Right Ear: Tympanic membrane  normal.     Left Ear: Tympanic membrane normal.     Mouth/Throat:     Mouth: Mucous membranes are moist.     Pharynx: Oropharynx is clear.  Eyes:     Extraocular Movements: Extraocular movements intact.     Conjunctiva/sclera: Conjunctivae normal.  Cardiovascular:     Rate and Rhythm: Normal rate and regular rhythm.  Pulmonary:     Effort: Pulmonary effort is normal.     Breath sounds: Normal breath sounds.  Skin:    General: Skin is warm and dry.  Neurological:     General: No focal deficit present.     Mental Status: She is alert and oriented to person, place, and time. Mental status is at baseline.  Psychiatric:        Mood and Affect: Mood normal.        Behavior: Behavior normal.        Thought Content: Thought content normal.        Judgment: Judgment normal.    BP (!) 134/93 (BP Location: Left Arm, Patient Position: Sitting, Cuff Size: Normal)    Pulse 82    Temp 98 F (36.7 C) (Oral)    Ht 5' 4.5" (1.638 m)    Wt 159 lb 0.6 oz (72.1 kg)    SpO2 99%    BMI 26.88 kg/m  Wt Readings from Last 3 Encounters:  03/08/21 159 lb 0.6 oz (72.1 kg)  02/20/21 161 lb (73 kg)  06/07/19 161 lb (73 kg)    Health Maintenance Due  Topic Date Due   COVID-19 Vaccine (5 - Booster for Moderna series) 01/25/2021    There are no preventive care reminders to display for this patient.   Lab Results  Component Value Date   TSH 1.44 09/18/2016   Lab Results  Component Value Date   WBC 6.3 02/20/2021   HGB 15.1 02/20/2021   HCT 43.6 02/20/2021   MCV 89.7 02/20/2021   PLT 298 02/20/2021   Lab Results  Component Value Date   NA 134 (L) 02/20/2021   K 3.8 02/20/2021   CO2 28 02/20/2021   GLUCOSE 97 02/20/2021   BUN 11 02/20/2021   CREATININE 0.85 02/20/2021   BILITOT 0.7 02/20/2021   ALKPHOS 49 09/18/2016   AST 21 02/20/2021   ALT 19 02/20/2021   PROT 7.2 02/20/2021   ALBUMIN 4.7 09/18/2016   CALCIUM 10.1 02/20/2021   EGFR 75 02/20/2021   Lab Results  Component Value  Date   CHOL 216 (H) 02/20/2021   Lab Results  Component Value Date   HDL 81 02/20/2021   Lab Results  Component Value Date  Graf 112 (H) 02/20/2021   Lab Results  Component Value Date   TRIG 120 02/20/2021   Lab Results  Component Value Date   CHOLHDL 2.7 02/20/2021   No results found for: HGBA1C     Assessment & Plan:    1. Acute non-recurrent pansinusitis Given duration and double sickening, start Augmentin. Adding benzonatate for cough, but discussed pulmonary hygiene. Recommended Mucinex and Flonase as well. Use albuterol inhaler PRN for wheezing/dyspnea. Continue supportive measures including rest, hydration, humidifier use, steam showers, warm compresses to sinuses, warm liquids with lemon and honey, and over-the-counter cough, cold, and analgesics as needed. Patient aware of signs/symptoms requiring further/urgent evaluation.    - amoxicillin-clavulanate (AUGMENTIN) 875-125 MG tablet; Take 1 tablet by mouth 2 (two) times daily for 10 days.  Dispense: 20 tablet; Refill: 0 - benzonatate (TESSALON) 200 MG capsule; Take 1 capsule (200 mg total) by mouth 2 (two) times daily as needed for cough.  Dispense: 20 capsule; Refill: 0  Follow-up if symptoms worsen or fail to improve.    Terrilyn Saver, NP

## 2021-03-22 ENCOUNTER — Ambulatory Visit: Payer: Medicare Other

## 2021-04-11 ENCOUNTER — Telehealth (INDEPENDENT_AMBULATORY_CARE_PROVIDER_SITE_OTHER): Payer: Medicare Other | Admitting: Family Medicine

## 2021-04-11 ENCOUNTER — Encounter: Payer: Self-pay | Admitting: Family Medicine

## 2021-04-11 DIAGNOSIS — U071 COVID-19: Secondary | ICD-10-CM | POA: Diagnosis not present

## 2021-04-11 MED ORDER — NIRMATRELVIR/RITONAVIR (PAXLOVID)TABLET: 3.0000 | ORAL_TABLET | Freq: Two times a day (BID) | ORAL | 0 refills | Status: AC

## 2021-04-11 NOTE — Progress Notes (Signed)
° ° °  Virtual Visit via telephone note  I connected with Amanda Moyer on 04/11/21 at 10:50 AM EST by a telephone enabled telemedicine application and verified that I am speaking with the correct person using two identifiers.   I discussed the limitations of evaluation and management by telemedicine and the availability of in person appointments. The patient expressed understanding and agreed to proceed.  Patient location: at home Provider location: in office  Subjective:    CC:  No chief complaint on file.   HPI: She reports that she started getting symptoms on Saturday/Sunday.  She had been exposed to someone over the weekend.  She has had cough and some sinus congestion.  No chest pain or shortness of breath.  She does have a pulse oximeter and it has been normal.  She would like to be considered for antiviral treatment.  She feels tired and rundown as well.   Past medical history, Surgical history, Family history not pertinant except as noted below, Social history, Allergies, and medications have been entered into the medical record, reviewed, and corrections made.    Objective:    General: Speaking clearly in complete sentences without any shortness of breath.  Alert and oriented x3.  Normal judgment. No apparent acute distress.    Impression and Recommendations:    Problem List Items Addressed This Visit   None Visit Diagnoses     COVID-19    -  Primary   Relevant Medications   nirmatrelvir/ritonavir EUA (PAXLOVID) 20 x 150 MG & 10 x 100MG  TABS      COVID-19-discussed options.  She is medium risk for complications so we discussed pros and cons of treatment with Paxil bid.  She would like to do the treatment.  Prescription sent to pharmacy encouraged to take it as soon as possible.  He still has some Tessalon Perles at home.  Did encourage her to call back if at any point she feels like we need to do anything different or she is having worsening or progressing  symptoms.  No orders of the defined types were placed in this encounter.   Meds ordered this encounter  Medications   nirmatrelvir/ritonavir EUA (PAXLOVID) 20 x 150 MG & 10 x 100MG  TABS    Sig: Take 3 tablets by mouth 2 (two) times daily for 5 days. (Take nirmatrelvir 150 mg two tablets twice daily for 5 days and ritonavir 100 mg one tablet twice daily for 5 days) Patient GFR is 75    Dispense:  30 tablet    Refill:  0     I discussed the assessment and treatment plan with the patient. The patient was provided an opportunity to ask questions and all were answered. The patient agreed with the plan and demonstrated an understanding of the instructions.   The patient was advised to call back or seek an in-person evaluation if the symptoms worsen or if the condition fails to improve as anticipated.  I spent 15 minutes on the day of the encounter to include pre-visit record review, face-to-face time with the patient and post visit ordering of test.   Beatrice Lecher, MD

## 2021-05-24 ENCOUNTER — Other Ambulatory Visit: Payer: Self-pay

## 2021-05-24 ENCOUNTER — Ambulatory Visit (INDEPENDENT_AMBULATORY_CARE_PROVIDER_SITE_OTHER): Payer: Medicare Other | Admitting: Family Medicine

## 2021-05-24 VITALS — BP 126/97 | HR 77

## 2021-05-24 DIAGNOSIS — I10 Essential (primary) hypertension: Secondary | ICD-10-CM | POA: Diagnosis not present

## 2021-05-24 MED ORDER — VALSARTAN-HYDROCHLOROTHIAZIDE 320-25 MG PO TABS: 1.0000 | ORAL_TABLET | Freq: Every day | ORAL | 3 refills | Status: DC

## 2021-05-24 NOTE — Progress Notes (Signed)
Meds ordered this encounter  ?Medications  ? valsartan-hydrochlorothiazide (DIOVAN-HCT) 320-25 MG tablet  ?  Sig: Take 1 tablet by mouth daily.  ?  Dispense:  90 tablet  ?  Refill:  3  ? ? ?

## 2021-05-24 NOTE — Progress Notes (Signed)
? ?Established Patient Office Visit ? ?Subjective:  ?Patient ID: Amanda Moyer, female    DOB: 1951/01/15  Age: 71 y.o. MRN: [id number] ? ?CC:  ?Chief Complaint  ?Patient presents with  ? Hypertension  ? ? ?HPI ? ?Amanda Moyer presents for a blood pressure check. She denies trouble sleeping, palpitations, or medication problems. ? ?Past Medical History:  ?Diagnosis Date  ? Retinal detachment   ? both eyes   ? ? ?Past Surgical History:  ?Procedure Laterality Date  ? arm fracture  1966  ? no hardware  ? CHOLECYSTECTOMY  1990  ? RETINAL DETACHMENT SURGERY Left 1996  ? TONSILLECTOMY  1960  ? ? ?Family History  ?Problem Relation Age of Onset  ? Prostate cancer Paternal Grandfather   ? Depression Maternal Grandmother   ? Hypertension Mother   ? Alzheimer's disease Mother   ? Parkinson's disease Mother   ? Hypertension Sister   ? Graves' disease Daughter   ? Raynaud syndrome Daughter   ? ? ?Social History  ? ?Socioeconomic History  ? Marital status: Married  ?  Spouse name: Lenny Pastel  ? Number of children: 2  ? Years of education: BS Sociolo  ? Highest education level: Not on file  ?Occupational History  ? Occupation: Homemaker  ?Tobacco Use  ? Smoking status: Never  ? Smokeless tobacco: Never  ?Substance and Sexual Activity  ? Alcohol use: Yes  ?  Alcohol/week: 2.0 standard drinks  ?  Types: 2 Glasses of wine per week  ? Drug use: No  ? Sexual activity: Yes  ?  Partners: Male  ?  Birth control/protection: None  ?Other Topics Concern  ? Not on file  ?Social History Narrative  ? Exercises regularly by walking for 40-6- minutes 3 times a week. 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 She is married to [place] and has 2 adult children.     ? ?Social Determinants of Health  ? ?Financial Resource Strain: Not on file  ?Food Insecurity: Not on file  ?Transportation Needs: Not on file  ?Physical Activity: Not on file  ?Stress: Not on file  ?Social Connections: Not on file  ?Intimate Partner Violence: Not on file   ? ? ?Outpatient Medications Prior to Visit  ?Medication Sig Dispense Refill  ? benzonatate (TESSALON) 200 MG capsule Take 1 capsule (200 mg total) by mouth 2 (two) times daily as needed for cough. 20 capsule 0  ? BIOTIN PO Take by mouth.    ? cholecalciferol (VITAMIN D) 1000 units tablet Take 1,000 Units by mouth daily.    ? DiphenhydrAMINE HCl (BENADRYL ALLERGY PO) Take by mouth as needed.    ? fluticasone (FLONASE) 50 MCG/ACT nasal spray Place 1 spray into both nostrils 2 (two) times daily.    ? linaclotide (LINZESS) 290 MCG CAPS capsule Take 1 capsule (290 mcg total) by mouth daily before breakfast. 90 capsule 1  ? loratadine (CLARITIN) 10 MG tablet Take 10 mg by mouth daily.    ? MAGNESIUM PO Take by mouth.    ? valsartan-hydrochlorothiazide (DIOVAN-HCT) 160-25 MG tablet TAKE 1 TABLET BY MOUTH DAILY. PLS D/C ORDER FOR THE DIOVAN HCT 320 30 tablet 0  ? VENTOLIN HFA 108 (90 Base) MCG/ACT inhaler INHALE 2 PUFFS INTO THE LUNGS EVERY 6 HOURS AS NEEDED FOR SHORTNESS OF BREATH 18 g 1  ? ?No facility-administered medications prior to visit.  ? ? ?Allergies  ?Allergen Reactions  ? Gramineae Pollens   ? Irbesartan-Hydrochlorothiazide   ?  Other reaction(s): Other (See Comments) ?Elevated blood pressure.  ? Zostavax [Zoster Vaccine Live] Other (See Comments)  ?  Caused joint pain  ? ? ?ROS ?Review of Systems ? ?  ?Objective:  ?  ?Physical Exam ? ?BP (!) 145/87   Pulse 78   SpO2 100%  ?Wt Readings from Last 3 Encounters:  ?03/08/21 159 lb 0.6 oz (72.1 kg)  ?02/20/21 161 lb (73 kg)  ?06/07/19 161 lb (73 kg)  ? ? ? ?Health Maintenance Due  ?Topic Date Due  ? COVID-19 Vaccine (5 - Booster for Moderna series) 01/25/2021  ? ? ?There are no preventive care reminders to display for this patient. ? ?Lab Results  ?Component Value Date  ? TSH 1.44 09/18/2016  ? ?Lab Results  ?Component Value Date  ? WBC 6.3 02/20/2021  ? HGB 15.1 02/20/2021  ? HCT 43.6 02/20/2021  ? MCV 89.7 02/20/2021  ? PLT 298 02/20/2021  ? ?Lab Results   ?Component Value Date  ? NA 134 (L) 02/20/2021  ? K 3.8 02/20/2021  ? CO2 28 02/20/2021  ? GLUCOSE 97 02/20/2021  ? BUN 11 02/20/2021  ? CREATININE 0.85 02/20/2021  ? BILITOT 0.7 02/20/2021  ? ALKPHOS 49 09/18/2016  ? AST 21 02/20/2021  ? ALT 19 02/20/2021  ? PROT 7.2 02/20/2021  ? ALBUMIN 4.7 09/18/2016  ? CALCIUM 10.1 02/20/2021  ? EGFR 75 02/20/2021  ? ?Lab Results  ?Component Value Date  ? CHOL 216 (H) 02/20/2021  ? ?Lab Results  ?Component Value Date  ? HDL 81 02/20/2021  ? ?Lab Results  ?Component Value Date  ? LDLCALC 112 (H) 02/20/2021  ? ?Lab Results  ?Component Value Date  ? TRIG 120 02/20/2021  ? ?Lab Results  ?Component Value Date  ? CHOLHDL 2.7 02/20/2021  ? ?No results found for: HGBA1C ? ?  ?Assessment & Plan:  ? ?Patient had an elevated blood pressure reading upon arrival (145/87). Patient was allowed to sit for 5 minutes before blood pressure was taken a second time.  ? ?After resting, the patient's blood pressure was 126/97. Primary care provider was consulted and we have sent in valsartan-hydrochlorothiazide 320/25 mg. Patient is aware to start taking this daily and she will follow up with pcp in 6 weeks for evaluation.  ? ?Problem List Items Addressed This Visit   ? ?  ? Cardiovascular and Mediastinum  ? Essential hypertension - Primary  ? Relevant Medications  ? valsartan-hydrochlorothiazide (DIOVAN-HCT) 320-25 MG tablet  ? ? ?Meds ordered this encounter  ?Medications  ? valsartan-hydrochlorothiazide (DIOVAN-HCT) 320-25 MG tablet  ?  Sig: Take 1 tablet by mouth daily.  ?  Dispense:  90 tablet  ?  Refill:  3  ? ? ?Follow-up: Return in about 6 weeks (around 07/05/2021) for follow up with primary care provider .  ? ? ?Gust Brooms, CMA ?

## 2021-06-15 DIAGNOSIS — Z1231 Encounter for screening mammogram for malignant neoplasm of breast: Secondary | ICD-10-CM | POA: Diagnosis not present

## 2021-06-15 LAB — HM MAMMOGRAPHY

## 2021-06-22 ENCOUNTER — Encounter: Payer: Self-pay | Admitting: Family Medicine

## 2021-07-17 ENCOUNTER — Encounter: Payer: Self-pay | Admitting: Family Medicine

## 2021-07-17 ENCOUNTER — Ambulatory Visit (INDEPENDENT_AMBULATORY_CARE_PROVIDER_SITE_OTHER): Payer: Medicare Other | Admitting: Family Medicine

## 2021-07-17 VITALS — BP 133/87 | HR 76 | Wt 158.0 lb

## 2021-07-17 DIAGNOSIS — Z0000 Encounter for general adult medical examination without abnormal findings: Secondary | ICD-10-CM

## 2021-07-17 DIAGNOSIS — Z780 Asymptomatic menopausal state: Secondary | ICD-10-CM

## 2021-07-17 DIAGNOSIS — I10 Essential (primary) hypertension: Secondary | ICD-10-CM

## 2021-07-17 LAB — BASIC METABOLIC PANEL WITH GFR
BUN: 12 mg/dL (ref 7–25)
CO2: 30 mmol/L (ref 20–32)
Calcium: 10.5 mg/dL — ABNORMAL HIGH (ref 8.6–10.4)
Chloride: 98 mmol/L (ref 98–110)
Creat: 0.89 mg/dL (ref 0.50–1.05)
Glucose, Bld: 96 mg/dL (ref 65–99)
Potassium: 3.9 mmol/L (ref 3.5–5.3)
Sodium: 137 mmol/L (ref 135–146)
eGFR: 71 mL/min/{1.73_m2} (ref >=60)

## 2021-07-17 NOTE — Progress Notes (Signed)
? ?Complete physical exam ? ?Patient: Amanda Moyer   DOB: 06-May-1950   71 y.o. Female  MRN: [id number] ? ?Subjective:  ?  ?Chief Complaint  ?Patient presents with  ? Annual Exam  ? ? ?Amanda Moyer is a 71 y.o. female who presents today for a complete physical exam. She reports consuming a general diet. Home exercise routine includes walking 2 miles per day . She generally feels well. She reports sleeping fairly well. She does have additional problems to discuss today.  ? ?Would like a recommendation for a foot surgeon.  She has a bunion and would like to have it addressed by an orthopedist.  Recommended Hettie Holstein. ? ?She did have her first shingles vaccine back in 2019 but had joint pain and swelling afterwards so was hold off on getting her second ? ?Last bone density was in 2020.  Mammogram is up-to-date. ? ?She is also interested in discussing medications for weight loss she is particularly interested in [place]. ? ? ?Most recent fall risk assessment: ? ?  02/28/2020  ? 10:40 AM  ?Fall Risk   ?Falls in the past year? 1  ?Number falls in past yr: 0  ?Injury with Fall? 1  ?Risk for fall due to : No Fall Risks  ?Follow up Falls prevention discussed  ? ?  ?Most recent depression screenings: ? ?  02/28/2020  ? 10:40 AM 02/28/2020  ? 10:39 AM  ?PHQ 2/9 Scores  ?PHQ - 2 Score 0 0  ? ? ? ? ?Past Surgical History:  ?Procedure Laterality Date  ? arm fracture  1966  ? no hardware  ? CHOLECYSTECTOMY  1990  ? RETINAL DETACHMENT SURGERY Left 1996  ? TONSILLECTOMY  1960  ? ?Social History  ? ?Tobacco Use  ? Smoking status: Never  ? Smokeless tobacco: Never  ?Substance Use Topics  ? Alcohol use: Yes  ?  Alcohol/week: 2.0 standard drinks  ?  Types: 2 Glasses of wine per week  ? Drug use: No  ? ?Allergies  ?Allergen Reactions  ? Gramineae Pollens   ? Irbesartan-Hydrochlorothiazide   ?  Other reaction(s): Other (See Comments) ?Elevated blood pressure.  ? Zostavax [Zoster Vaccine Live] Other (See Comments)  ?  Caused joint  pain  ? ?  ? ?Patient Care Team: ?Agapito Games, MD as PCP - General (Family Medicine) ?Michel Harrow, MD as Referring Physician (Dermatology)  ? ?Outpatient Medications Prior to Visit  ?Medication Sig  ? BIOTIN PO Take by mouth.  ? Calcium Carbonate (CALCIUM 600 PO) Take by mouth.  ? cholecalciferol (VITAMIN D) 1000 units tablet Take 1,000 Units by mouth daily.  ? DiphenhydrAMINE HCl (BENADRYL ALLERGY PO) Take by mouth as needed.  ? fluticasone (FLONASE) 50 MCG/ACT nasal spray Place 1 spray into both nostrils 2 (two) times daily.  ? linaclotide (LINZESS) 290 MCG CAPS capsule Take 1 capsule (290 mcg total) by mouth daily before breakfast.  ? loratadine (CLARITIN) 10 MG tablet Take 10 mg by mouth daily.  ? MAGNESIUM PO Take by mouth.  ? thiamine (VITAMIN B-1) 50 MG tablet Take 50 mg by mouth daily.  ? valsartan-hydrochlorothiazide (DIOVAN-HCT) 320-25 MG tablet Take 1 tablet by mouth daily.  ? VENTOLIN HFA 108 (90 Base) MCG/ACT inhaler INHALE 2 PUFFS INTO THE LUNGS EVERY 6 HOURS AS NEEDED FOR SHORTNESS OF BREATH  ? [DISCONTINUED] benzonatate (TESSALON) 200 MG capsule Take 1 capsule (200 mg total) by mouth 2 (two) times daily as needed for cough.  ? ?No facility-administered medications  prior to visit.  ? ? ?ROS ? ? ? ? ?   ?Objective:  ? ?  ?BP 133/87   Pulse 76   Wt 158 lb (71.7 kg)   SpO2 100%   BMI 26.70 kg/m?  ? ? ?Physical Exam ?Vitals and nursing note reviewed.  ?Constitutional:   ?   Appearance: She is well-developed.  ?HENT:  ?   Head: Normocephalic and atraumatic.  ?   Right Ear: External ear normal.  ?   Left Ear: External ear normal.  ?   Nose: Nose normal.  ?Eyes:  ?   Conjunctiva/sclera: Conjunctivae normal.  ?   Pupils: Pupils are equal, round, and reactive to light.  ?Neck:  ?   Thyroid: No thyromegaly.  ?Cardiovascular:  ?   Rate and Rhythm: Normal rate and regular rhythm.  ?   Heart sounds: Normal heart sounds.  ?Pulmonary:  ?   Effort: Pulmonary effort is normal.  ?   Breath sounds:  Normal breath sounds. No wheezing.  ?Abdominal:  ?   General: Bowel sounds are normal.  ?   Palpations: Abdomen is soft.  ?Musculoskeletal:  ?   Cervical back: Neck supple.  ?Lymphadenopathy:  ?   Cervical: No cervical adenopathy.  ?Skin: ?   General: Skin is warm and dry.  ?   Coloration: Skin is not pale.  ?Neurological:  ?   Mental Status: She is alert and oriented to person, place, and time.  ?Psychiatric:     ?   Behavior: Behavior normal.  ?  ? ?No results found for any visits on 07/17/21. ? ?   ?Assessment & Plan:  ?  ?Routine Health Maintenance and Physical Exam ? ?Keep up a regular exercise program and make sure you are eating a healthy diet ?Try to eat 4 servings of dairy a day, or if you are lactose intolerant take a calcium with vitamin D daily.  ?Your vaccines are up to date.  ? ? ?Immunization History  ?Administered Date(s) Administered  ? Fluad Quad(high Dose 65+) 02/28/2020, 11/30/2020  ? Influenza,inj,Quad PF,6+ Mos 11/23/2013, 12/27/2014, 05/02/2016, 12/11/2017  ? Moderna Sars-Covid-2 Vaccination 05/07/2019, 06/05/2019, 03/20/2020, 11/30/2020  ? PPD Test 06/01/2004  ? Pneumococcal Conjugate-13 08/04/2017  ? Pneumococcal Polysaccharide-23 02/28/2020  ? Td (Adult) 06/01/2004  ? Tdap 06/02/2014  ? Zoster Recombinat (Shingrix) 08/27/2017  ? ? ?Health Maintenance  ?Topic Date Due  ? COVID-19 Vaccine (5 - Booster for Moderna series) 08/03/2022 (Originally 01/25/2021)  ? INFLUENZA VACCINE  10/23/2021  ? MAMMOGRAM  06/16/2022  ? DEXA SCAN  09/04/2023  ? TETANUS/TDAP  06/01/2024  ? COLONOSCOPY (Pts 45-68yrs Insurance coverage will need to be confirmed)  01/17/2025  ? Pneumonia Vaccine 15+ Years old  Completed  ? Hepatitis C Screening  Completed  ? HPV VACCINES  Aged Out  ? Zoster Vaccines- Shingrix  Discontinued  ? ? ?Discussed health benefits of physical activity, and encouraged her to engage in regular exercise appropriate for her age and condition. ? ?Problem List Items Addressed This Visit   ? ?  ?  Cardiovascular and Mediastinum  ? Essential hypertension  ? Relevant Orders  ? BASIC METABOLIC PANEL WITH GFR  ? ?Other Visit Diagnoses   ? ? Wellness examination    -  Primary  ? Relevant Orders  ? BASIC METABOLIC PANEL WITH GFR  ? Post-menopausal      ? Relevant Orders  ? DG Bone Density  ? ?  ? ?Return in about 6 months (around 01/16/2022)  for Hypertension. ? ? Keep up a regular exercise program and make sure you are eating a healthy diet ?Try to eat 4 servings of dairy a day, or if you are lactose intolerant take a calcium with vitamin D daily.  ?Your vaccines are up to date.  ?We will order updated bone density testing.  Mammogram is up-to-date.  Declines second shingles vaccine because of reactions after the first 1.  Tdap is up-to-date.  Colonoscopy is up-to-date. ? ?BMI 26-[facility].  We discussed that Medicare does not cover this medication so she may want to do some research it is usually upwards of $1000 or more per month out-of-pocket.  In the meantime just working on making small changes with her diet and keeping up her exercise she does walk 2 miles per day which is great. ? ? ?Nani Gasseratherine Layia Walla, MD ? ? ?

## 2021-07-18 NOTE — Progress Notes (Signed)
Hi Deborah, overall metabolic panel looks great.  Your calcium is just up by 10th of a point but that is perfectly fine and safe some not concerned.

## 2021-10-09 DIAGNOSIS — M79671 Pain in right foot: Secondary | ICD-10-CM | POA: Diagnosis not present

## 2021-12-03 ENCOUNTER — Ambulatory Visit (INDEPENDENT_AMBULATORY_CARE_PROVIDER_SITE_OTHER): Payer: Medicare Other | Admitting: Family Medicine

## 2021-12-03 DIAGNOSIS — Z0000 Encounter for general adult medical examination without abnormal findings: Secondary | ICD-10-CM | POA: Diagnosis not present

## 2021-12-03 DIAGNOSIS — Z780 Asymptomatic menopausal state: Secondary | ICD-10-CM

## 2021-12-03 NOTE — Patient Instructions (Addendum)
MEDICARE ANNUAL WELLNESS VISIT Health Maintenance Summary and Written Plan of Care  Amanda Moyer ,  Thank you for allowing me to perform your Medicare Annual Wellness Visit and for your ongoing commitment to your health.   Health Maintenance & Immunization History Health Maintenance  Topic Date Due   INFLUENZA VACCINE  06/23/2022 (Originally 10/23/2021)   COVID-19 Vaccine (5 - Moderna series) 08/03/2022 (Originally 01/25/2021)   MAMMOGRAM  06/16/2022   DEXA SCAN  09/04/2023   TETANUS/TDAP  06/01/2024   COLONOSCOPY (Pts 45-23yrs Insurance coverage will need to be confirmed)  01/17/2025   Pneumonia Vaccine 63+ Years old  Completed   Hepatitis C Screening  Completed   HPV VACCINES  Aged Out   Zoster Vaccines- Shingrix  Discontinued   Immunization History  Administered Date(s) Administered   Fluad Quad(high Dose 65+) 02/28/2020, 11/30/2020   Influenza,inj,Quad PF,6+ Mos 11/23/2013, 12/27/2014, 05/02/2016, 12/11/2017   Moderna Sars-Covid-2 Vaccination 05/07/2019, 06/05/2019, 03/20/2020, 11/30/2020   PPD Test 06/01/2004   Pneumococcal Conjugate-13 08/04/2017   Pneumococcal Polysaccharide-23 02/28/2020   Td (Adult) 06/01/2004   Tdap 06/02/2014   Zoster Recombinat (Shingrix) 08/27/2017    These are the patient goals that we discussed:  Goals Addressed               This Visit's Progress     Patient Stated (pt-stated)        Would like to loose 25-30 lbs.          This is a list of Health Maintenance Items that are overdue or due now: Influenza vaccine Bone densitometry screening  Orders/Referrals Placed Today: Orders Placed This Encounter  Procedures   DEXAScan    Standing Status:   Future    Standing Expiration Date:   12/04/2022    Scheduling Instructions:     Patient goes to [facility]. Please call patient to schedule.    Order Specific Question:   Reason for exam:    Answer:   Post Menopausal    Order Specific Question:   Preferred imaging location?    Answer:    External    (Contact our referral department at [phone] if you have not spoken with someone about your referral appointment within the next 5 days)    Follow-up Plan Follow-up with Agapito Games, MD as planned Schedule your bone density if you don't hear from them in one week. Medicare wellness visit in one year. Patient will access AVS on my chart.      Health Maintenance, Female Adopting a healthy lifestyle and getting preventive care are important in promoting health and wellness. Ask your health care provider about: The right schedule for you to have regular tests and exams. Things you can do on your own to prevent diseases and keep yourself healthy. What should I know about diet, weight, and exercise? Eat a healthy diet  Eat a diet that includes plenty of vegetables, fruits, low-fat dairy products, and lean protein. Do not eat a lot of foods that are high in solid fats, added sugars, or sodium. Maintain a healthy weight Body mass index (BMI) is used to identify weight problems. It estimates body fat based on height and weight. Your health care provider can help determine your BMI and help you achieve or maintain a healthy weight. Get regular exercise Get regular exercise. This is one of the most important things you can do for your health. Most adults should: Exercise for at least 150 minutes each week. The exercise should increase your heart  rate and make you sweat (moderate-intensity exercise). Do strengthening exercises at least twice a week. This is in addition to the moderate-intensity exercise. Spend less time sitting. Even light physical activity can be beneficial. Watch cholesterol and blood lipids Have your blood tested for lipids and cholesterol at 69 years of age, then have this test every 5 years. Have your cholesterol levels checked more often if: Your lipid or cholesterol levels are high. You are older than 69 years of age. You are at high risk for  heart disease. What should I know about cancer screening? Depending on your health history and family history, you may need to have cancer screening at various ages. This may include screening for: Breast cancer. Cervical cancer. Colorectal cancer. Skin cancer. Lung cancer. What should I know about heart disease, diabetes, and high blood pressure? Blood pressure and heart disease High blood pressure causes heart disease and increases the risk of stroke. This is more likely to develop in people who have high blood pressure readings or are overweight. Have your blood pressure checked: Every 3-5 years if you are 94-2 years of age. Every year if you are 18 years old or older. Diabetes Have regular diabetes screenings. This checks your fasting blood sugar level. Have the screening done: Once every three years after age 59 if you are at a normal weight and have a low risk for diabetes. More often and at a younger age if you are overweight or have a high risk for diabetes. What should I know about preventing infection? Hepatitis B If you have a higher risk for hepatitis B, you should be screened for this virus. Talk with your health care provider to find out if you are at risk for hepatitis B infection. Hepatitis C Testing is recommended for: Everyone born from [birth year] through 1965. Anyone with known risk factors for hepatitis C. Sexually transmitted infections (STIs) Get screened for STIs, including gonorrhea and chlamydia, if: You are sexually active and are younger than 69 years of age. You are older than 69 years of age and your health care provider tells you that you are at risk for this type of infection. Your sexual activity has changed since you were last screened, and you are at increased risk for chlamydia or gonorrhea. Ask your health care provider if you are at risk. Ask your health care provider about whether you are at high risk for HIV. Your health care provider may recommend a  prescription medicine to help prevent HIV infection. If you choose to take medicine to prevent HIV, you should first get tested for HIV. You should then be tested every 3 months for as long as you are taking the medicine. Pregnancy If you are about to stop having your period (premenopausal) and you may become pregnant, seek counseling before you get pregnant. Take 400 to 800 micrograms (mcg) of folic acid every day if you become pregnant. Ask for birth control (contraception) if you want to prevent pregnancy. Osteoporosis and menopause Osteoporosis is a disease in which the bones lose minerals and strength with aging. This can result in bone fractures. If you are 62 years old or older, or if you are at risk for osteoporosis and fractures, ask your health care provider if you should: Be screened for bone loss. Take a calcium or vitamin D supplement to lower your risk of fractures. Be given hormone replacement therapy (HRT) to treat symptoms of menopause. Follow these instructions at home: Alcohol use Do not drink alcohol if:  Your health care provider tells you not to drink. You are pregnant, may be pregnant, or are planning to become pregnant. If you drink alcohol: Limit how much you have to: 0-1 drink a day. Know how much alcohol is in your drink. In the [place] one drink equals one 12 oz bottle of beer (355 mL), one 5 oz glass of wine (148 mL), or one 1 oz glass of hard liquor (44 mL). Lifestyle Do not use any products that contain nicotine or tobacco. These products include cigarettes, chewing tobacco, and vaping devices, such as e-cigarettes. If you need help quitting, ask your health care provider. Do not use street drugs. Do not share needles. Ask your health care provider for help if you need support or information about quitting drugs. General instructions Schedule regular health, dental, and eye exams. Stay current with your vaccines. Tell your health care provider if: You often  feel depressed. You have ever been abused or do not feel safe at home. Summary Adopting a healthy lifestyle and getting preventive care are important in promoting health and wellness. Follow your health care provider's instructions about healthy diet, exercising, and getting tested or screened for diseases. Follow your health care provider's instructions on monitoring your cholesterol and blood pressure. This information is not intended to replace advice given to you by your health care provider. Make sure you discuss any questions you have with your health care provider. Document Revised: 07/31/2020 Document Reviewed: 07/31/2020 Elsevier Patient Education  2023 [employer].

## 2021-12-03 NOTE — Progress Notes (Signed)
MEDICARE ANNUAL WELLNESS VISIT  12/03/2021  Telephone Visit Disclaimer This Medicare AWV was conducted by telephone due to national recommendations for restrictions regarding the COVID-19 Pandemic (e.g. social distancing).  I verified, using two identifiers, that I am speaking with Georjean Mode or their authorized healthcare agent. I discussed the limitations, risks, security, and privacy concerns of performing an evaluation and management service by telephone and the potential availability of an in-person appointment in the future. The patient expressed understanding and agreed to proceed.  Location of Patient: Home Location of Provider (nurse):  In the office.  Subjective:    Bradyn Vassey is a 71 y.o. female patient of Metheney, Barbarann Ehlers, MD who had a Medicare Annual Wellness Visit today via telephone. Serenitie is Retired and lives with their spouse. she has 2 children. she reports that she is socially active and does interact with friends/family regularly. she is moderately physically active and enjoys spending time with the grandchildren, reading and traveling.   Patient Care Team: Agapito Games, MD as PCP - General (Family Medicine) Michel Harrow, MD as Referring Physician (Dermatology)     12/03/2021    2:17 PM 02/28/2020   10:43 AM 12/11/2017    9:31 AM 05/12/2014    3:30 PM  Advanced Directives  Does Patient Have a Medical Advance Directive? Yes Yes Yes Yes  Type of Advance Directive Living will Living will;Healthcare Power of [employer] Power of [place];Living will Living will  Does patient want to make changes to medical advance directive? No - Patient declined  No - Patient declined No - Patient declined  Copy of Healthcare Power of Attorney in Chart?  No - copy requested No - copy requested No - copy requested    Hospital Utilization Over the Past 12 Months: # of hospitalizations or ER visits: 0 # of surgeries: 0  Review of Systems    Patient  reports that her overall health is unchanged compared to last year.  History obtained from chart review and the patient  Patient Reported Readings (BP, Pulse, CBG, Weight, etc) none  Pain Assessment Pain : No/denies pain     Current Medications & Allergies (verified) Allergies as of 12/03/2021       Reactions   Gramineae Pollens    Irbesartan-hydrochlorothiazide    Other reaction(s): Other (See Comments) Elevated blood pressure.   Zostavax [zoster Vaccine Live] Other (See Comments)   Caused joint pain        Medication List        Accurate as of December 03, 2021  2:32 PM. If you have any questions, ask your nurse or doctor.          BENADRYL ALLERGY PO Take by mouth as needed.   BIOTIN PO Take by mouth.   CALCIUM 600 PO Take by mouth.   cholecalciferol 1000 units tablet Commonly known as: VITAMIN D Take 1,000 Units by mouth daily.   fluticasone 50 MCG/ACT nasal spray Commonly known as: FLONASE Place 1 spray into both nostrils 2 (two) times daily.   linaclotide 290 MCG Caps capsule Commonly known as: Linzess Take 1 capsule (290 mcg total) by mouth daily before breakfast. What changed: additional instructions   loratadine 10 MG tablet Commonly known as: CLARITIN Take 10 mg by mouth daily.   MAGNESIUM PO Take by mouth.   thiamine 50 MG tablet Commonly known as: VITAMIN B-1 Take 50 mg by mouth daily.   valsartan-hydrochlorothiazide 320-25 MG tablet Commonly known as: DIOVAN-HCT Take 1  tablet by mouth daily.   Ventolin HFA 108 (90 Base) MCG/ACT inhaler Generic drug: albuterol INHALE 2 PUFFS INTO THE LUNGS EVERY 6 HOURS AS NEEDED FOR SHORTNESS OF BREATH        History (reviewed): Past Medical History:  Diagnosis Date   Retinal detachment    both eyes    Past Surgical History:  Procedure Laterality Date   arm fracture  1966   no hardware   CHOLECYSTECTOMY  1990   RETINAL DETACHMENT SURGERY Left 1996   TONSILLECTOMY  1960    Family History  Problem Relation Age of Onset   Prostate cancer Paternal Grandfather    Depression Maternal Grandmother    Hypertension Mother    Alzheimer's disease Mother    Parkinson's disease Mother    Hypertension Sister    Graves' disease Daughter    Raynaud syndrome Daughter    Social History   Socioeconomic History   Marital status: Married    Spouse name: [name]   Number of children: 2   Years of education: BS Sociology   Highest education level: Bachelor's degree (e.g., BA, AB, BS)  Occupational History   Occupation: Homemaker  Tobacco Use   Smoking status: Never   Smokeless tobacco: Never  Vaping Use   Vaping Use: Never used  Substance and Sexual Activity   Alcohol use: Yes    Alcohol/week: 2.0 standard drinks of alcohol    Types: 2 Glasses of wine per week   Drug use: No   Sexual activity: Yes    Partners: Male    Birth control/protection: None  Other Topics Concern   Not on file  Social History Narrative   Lives with her husband. She has two children. She enjoys spending time with the grandchildren, reading and traveling.   Social Determinants of Health   Financial Resource Strain: Low Risk  (12/03/2021)   Overall Financial Resource Strain (CARDIA)    Difficulty of Paying Living Expenses: Not hard at all  Food Insecurity: No Food Insecurity (12/03/2021)   Hunger Vital Sign    Worried About Running Out of Food in the Last Year: Never true    Ran Out of Food in the Last Year: Never true  Transportation Needs: No Transportation Needs (12/03/2021)   PRAPARE - Administrator, Civil Service (Medical): No    Lack of Transportation (Non-Medical): No  Physical Activity: Sufficiently Active (12/03/2021)   Exercise Vital Sign    Days of Exercise per Week: 4 days    Minutes of Exercise per Session: 60 min  Stress: No Stress Concern Present (12/03/2021)   [employer] of Occupational Health - Occupational Stress Questionnaire    Feeling of  Stress : Not at all  Social Connections: Moderately Integrated (12/03/2021)   Social Connection and Isolation Panel [NHANES]    Frequency of Communication with Friends and Family: More than three times a week    Frequency of Social Gatherings with Friends and Family: More than three times a week    Attends Religious Services: Never    Database administrator or Organizations: Yes    Attends Engineer, structural: More than 4 times per year    Marital Status: Married    Activities of Daily Living    12/03/2021    2:17 PM  In your present state of health, do you have any difficulty performing the following activities:  Hearing? 0  Vision? 0  Difficulty concentrating or making decisions? 0  Walking or climbing  stairs? 0  Dressing or bathing? 0  Doing errands, shopping? 0  Preparing Food and eating ? N  Using the Toilet? N  In the past six months, have you accidently leaked urine? N  Do you have problems with loss of bowel control? N  Managing your Medications? N  Managing your Finances? N  Housekeeping or managing your Housekeeping? N    Patient Education/ Literacy How often do you need to have someone help you when you read instructions, pamphlets, or other written materials from your doctor or pharmacy?: 1 - Never What is the last grade level you completed in school?: bachelor's degree  Exercise Current Exercise Habits: Home exercise routine, Type of exercise: walking, Time (Minutes): 60, Frequency (Times/Week): 4, Weekly Exercise (Minutes/Week): 240, Intensity: Moderate, Exercise limited by: None identified  Diet Patient reports consuming 2 meals a day and 1 snack(s) a day Patient reports that her primary diet is: Regular Patient reports that she does have regular access to food.   Depression Screen    12/03/2021    2:16 PM 02/28/2020   10:40 AM 02/28/2020   10:39 AM 05/01/2018    3:39 PM 12/11/2017    9:30 AM 12/11/2017    9:24 AM 01/16/2017    9:31 AM  PHQ 2/9  Scores  PHQ - 2 Score 0 0 0 1 1 0 0     Fall Risk    12/03/2021    2:16 PM 02/28/2020   10:40 AM 12/11/2017    9:30 AM 12/11/2017    9:24 AM  Fall Risk   Falls in the past year? 0 1 No No  Number falls in past yr: 0 0    Injury with Fall? 0 1    Risk for fall due to : No Fall Risks;History of fall(s) No Fall Risks    Follow up Falls evaluation completed;Education provided Falls prevention discussed       Objective:  Aravis Sergeant seemed alert and oriented and she participated appropriately during our telephone visit.  Blood Pressure Weight BMI  BP Readings from Last 3 Encounters:  07/17/21 133/87  05/24/21 (!) 126/97  03/08/21 (!) 134/93   Wt Readings from Last 3 Encounters:  07/17/21 158 lb (71.7 kg)  03/08/21 159 lb 0.6 oz (72.1 kg)  02/20/21 161 lb (73 kg)   BMI Readings from Last 1 Encounters:  07/17/21 26.70 kg/m    *Unable to obtain current vital signs, weight, and BMI due to telephone visit type  Hearing/Vision  Jurnee did not seem to have difficulty with hearing/understanding during the telephone conversation Reports that she has not had a formal eye exam by an eye care professional within the past year Reports that she has not had a formal hearing evaluation within the past year *Unable to fully assess hearing and vision during telephone visit type  Cognitive Function:    12/03/2021    2:20 PM 12/11/2017    9:30 AM  6CIT Screen  What Year? 0 points 0 points  What month? 0 points 0 points  What time? 0 points 0 points  Count back from 20 0 points 0 points  Months in reverse 0 points 0 points  Repeat phrase 0 points 0 points  Total Score 0 points 0 points   (Normal:0-7, Significant for Dysfunction: >8)  Normal Cognitive Function Screening: Yes   Immunization & Health Maintenance Record Immunization History  Administered Date(s) Administered   Fluad Quad(high Dose 65+) 02/28/2020, 11/30/2020   Influenza,inj,Quad PF,6+ Mos 11/23/2013,  12/27/2014,  05/02/2016, 12/11/2017   Moderna Sars-Covid-2 Vaccination 05/07/2019, 06/05/2019, 03/20/2020, 11/30/2020   PPD Test 06/01/2004   Pneumococcal Conjugate-13 08/04/2017   Pneumococcal Polysaccharide-23 02/28/2020   Td (Adult) 06/01/2004   Tdap 06/02/2014   Zoster Recombinat (Shingrix) 08/27/2017    Health Maintenance  Topic Date Due   INFLUENZA VACCINE  06/23/2022 (Originally 10/23/2021)   COVID-19 Vaccine (5 - Moderna series) 08/03/2022 (Originally 01/25/2021)   MAMMOGRAM  06/16/2022   DEXA SCAN  09/04/2023   TETANUS/TDAP  06/01/2024   COLONOSCOPY (Pts 45-72yrs Insurance coverage will need to be confirmed)  01/17/2025   Pneumonia Vaccine 61+ Years old  Completed   Hepatitis C Screening  Completed   HPV VACCINES  Aged Out   Zoster Vaccines- Shingrix  Discontinued       Assessment  This is a routine wellness examination for [employer].  Health Maintenance: Due or Overdue There are no preventive care reminders to display for this patient.   Elvina Ran does not need a referral for Community Assistance: Care Management:   no Social Work:    no Prescription Assistance:  no Nutrition/Diabetes Education:  no   Plan:  Personalized Goals  Goals Addressed               This Visit's Progress     Patient Stated (pt-stated)        Would like to loose 25-30 lbs.        Personalized Health Maintenance & Screening Recommendations  Influenza vaccine Bone densitometry screening  Lung Cancer Screening Recommended: no (Low Dose CT Chest recommended if Age 3-80 years, 30 pack-year currently smoking OR have quit w/in past 15 years) Hepatitis C Screening recommended: no HIV Screening recommended: no  Advanced Directives: Written information was not prepared per patient's request.  Referrals & Orders Orders Placed This Encounter  Procedures   [place]    Follow-up Plan Follow-up with Hali Marry, MD as planned Schedule your bone density if you don't hear  from them in one week. Medicare wellness visit in one year. Patient will access AVS on my chart.   I have personally reviewed and noted the following in the patient's chart:   Medical and social history Use of alcohol, tobacco or illicit drugs  Current medications and supplements Functional ability and status Nutritional status Physical activity Advanced directives List of other physicians Hospitalizations, surgeries, and ER visits in previous 12 months Vitals Screenings to include cognitive, depression, and falls Referrals and appointments  In addition, I have reviewed and discussed with Jeannie Done certain preventive protocols, quality metrics, and best practice recommendations. A written personalized care plan for preventive services as well as general preventive health recommendations is available and can be mailed to the patient at her request.      Tinnie Gens, RN BSN  12/03/2021

## 2022-01-17 ENCOUNTER — Ambulatory Visit: Payer: Medicare Other | Admitting: Family Medicine

## 2022-03-04 ENCOUNTER — Ambulatory Visit: Payer: Medicare Other | Admitting: Family Medicine

## 2022-04-08 ENCOUNTER — Ambulatory Visit (INDEPENDENT_AMBULATORY_CARE_PROVIDER_SITE_OTHER): Payer: Medicare Other | Admitting: Family Medicine

## 2022-04-08 ENCOUNTER — Encounter: Payer: Self-pay | Admitting: Family Medicine

## 2022-04-08 VITALS — BP 127/84 | HR 76 | Wt 159.0 lb

## 2022-04-08 DIAGNOSIS — I10 Essential (primary) hypertension: Secondary | ICD-10-CM | POA: Diagnosis not present

## 2022-04-08 DIAGNOSIS — E785 Hyperlipidemia, unspecified: Secondary | ICD-10-CM

## 2022-04-08 DIAGNOSIS — Z7689 Persons encountering health services in other specified circumstances: Secondary | ICD-10-CM

## 2022-04-08 MED ORDER — QSYMIA 3.75-23 MG PO CP24: 1.0000 | ORAL_CAPSULE | Freq: Every morning | ORAL | 0 refills | Status: DC

## 2022-04-08 NOTE — Progress Notes (Signed)
   Established Patient Office Visit  Subjective   Patient ID: Amanda Moyer, female    DOB: September 16, 1950  Age: 72 y.o. MRN: [id number]  Chief Complaint  Patient presents with   Hypertension    HPI   Hypertension- Pt denies chest pain, SOB, dizziness, or heart palpitations.  Taking meds as directed w/o problems.  Denies medication side effects.   She would like to discuss weight management as well. She would like ot lose about 25 lbs. She has changed her diet and it doing well with that.      ROS    Objective:     BP 127/84   Pulse 76   Wt 159 lb (72.1 kg)   SpO2 95%   BMI 26.87 kg/m    Physical Exam Vitals and nursing note reviewed.  Constitutional:      Appearance: She is well-developed.  HENT:     Head: Normocephalic and atraumatic.  Cardiovascular:     Rate and Rhythm: Normal rate and regular rhythm.     Heart sounds: Normal heart sounds.  Pulmonary:     Effort: Pulmonary effort is normal.     Breath sounds: Normal breath sounds.  Skin:    General: Skin is warm and dry.  Neurological:     Mental Status: She is alert and oriented to person, place, and time.  Psychiatric:        Behavior: Behavior normal.      No results found for any visits on 04/08/22.    The 10-year ASCVD risk score (Arnett DK, et al., 2019) is: 10.5%    Assessment & Plan:   Problem List Items Addressed This Visit       Cardiovascular and Mediastinum   Essential hypertension - Primary    Well controlled. Continue current regimen. Follow up in  6 mo       Relevant Orders   Lipid Panel w/reflex Direct LDL   COMPLETE METABOLIC PANEL WITH GFR   CBC     Other   Hyperlipidemia    Recheck lipoids      Relevant Orders   Lipid Panel w/reflex Direct LDL   COMPLETE METABOLIC PANEL WITH GFR   CBC   Encounter for weight management    Visit #: 1 Starting Weight: 159 lbs  Current weight:159 lbs Previous weight: Change in weight: Goal weight:135 lbs  Dietary goals: work  on more veggie and portion control  Exercise goals: setting weekly exercise goal.  Medication: Qsymia  Follow-up and referrals: 4 weeks and will adjust dose at that time       Relevant Medications   Phentermine-Topiramate (QSYMIA) 3.75-23 MG CP24    Return in about 4 weeks (around 05/06/2022) for New start medication.    Beatrice Lecher, MD

## 2022-04-08 NOTE — Assessment & Plan Note (Signed)
Well controlled. Continue current regimen. Follow up in  6 mo  

## 2022-04-08 NOTE — Assessment & Plan Note (Signed)
Visit #: 1 Starting Weight: 159 lbs  Current weight:159 lbs Previous weight: Change in weight: Goal weight:135 lbs  Dietary goals: work on more veggie and portion control  Exercise goals: setting weekly exercise goal.  Medication: Qsymia  Follow-up and referrals: 4 weeks and will adjust dose at that time

## 2022-04-08 NOTE — Assessment & Plan Note (Signed)
Recheck lipoids

## 2022-04-18 ENCOUNTER — Telehealth: Payer: Self-pay | Admitting: *Deleted

## 2022-04-18 NOTE — Telephone Encounter (Signed)
Pt called and LVM stating that the pharmacy sent her a text about the weight loss medication

## 2022-04-22 ENCOUNTER — Telehealth: Payer: Self-pay

## 2022-04-22 NOTE — Telephone Encounter (Signed)
Pt needs a pa for :Phentermine-Topiramate (QSYMIA) 3.75-23 MG CP24

## 2022-04-30 NOTE — Telephone Encounter (Signed)
Initiated Prior authorization RQS:[id number] 2.5MG /0.5ML pen-injectors  Via: Covermymeds Case/Key:[id number]  Status: Pending as of 04/30/22 Reason: Notified Pt via: Mychart

## 2022-05-06 ENCOUNTER — Ambulatory Visit: Payer: BLUE CROSS/BLUE SHIELD | Admitting: Family Medicine

## 2022-05-15 DIAGNOSIS — M2011 Hallux valgus (acquired), right foot: Secondary | ICD-10-CM | POA: Diagnosis not present

## 2022-05-15 DIAGNOSIS — M2021 Hallux rigidus, right foot: Secondary | ICD-10-CM | POA: Diagnosis not present

## 2022-05-15 DIAGNOSIS — M205X1 Other deformities of toe(s) (acquired), right foot: Secondary | ICD-10-CM | POA: Diagnosis not present

## 2022-05-16 DIAGNOSIS — M2011 Hallux valgus (acquired), right foot: Secondary | ICD-10-CM | POA: Diagnosis not present

## 2022-05-16 DIAGNOSIS — M205X1 Other deformities of toe(s) (acquired), right foot: Secondary | ICD-10-CM | POA: Diagnosis not present

## 2022-05-16 DIAGNOSIS — Z79899 Other long term (current) drug therapy: Secondary | ICD-10-CM | POA: Diagnosis not present

## 2022-05-16 DIAGNOSIS — M2021 Hallux rigidus, right foot: Secondary | ICD-10-CM | POA: Diagnosis not present

## 2022-05-16 DIAGNOSIS — Z888 Allergy status to other drugs, medicaments and biological substances status: Secondary | ICD-10-CM | POA: Diagnosis not present

## 2022-05-16 DIAGNOSIS — Z885 Allergy status to narcotic agent status: Secondary | ICD-10-CM | POA: Diagnosis not present

## 2022-05-16 DIAGNOSIS — S93124A Dislocation of metatarsophalangeal joint of right lesser toe(s), initial encounter: Secondary | ICD-10-CM | POA: Diagnosis not present

## 2022-05-16 DIAGNOSIS — J45909 Unspecified asthma, uncomplicated: Secondary | ICD-10-CM | POA: Diagnosis not present

## 2022-05-16 DIAGNOSIS — G8918 Other acute postprocedural pain: Secondary | ICD-10-CM | POA: Diagnosis not present

## 2022-05-16 DIAGNOSIS — K219 Gastro-esophageal reflux disease without esophagitis: Secondary | ICD-10-CM | POA: Diagnosis not present

## 2022-05-16 DIAGNOSIS — I10 Essential (primary) hypertension: Secondary | ICD-10-CM | POA: Diagnosis not present

## 2022-05-16 DIAGNOSIS — M1990 Unspecified osteoarthritis, unspecified site: Secondary | ICD-10-CM | POA: Diagnosis not present

## 2022-05-29 DIAGNOSIS — M79671 Pain in right foot: Secondary | ICD-10-CM | POA: Diagnosis not present

## 2022-05-31 ENCOUNTER — Other Ambulatory Visit: Payer: Self-pay | Admitting: Family Medicine

## 2022-05-31 DIAGNOSIS — I10 Essential (primary) hypertension: Secondary | ICD-10-CM

## 2022-06-26 DIAGNOSIS — M79671 Pain in right foot: Secondary | ICD-10-CM | POA: Diagnosis not present

## 2022-07-22 DIAGNOSIS — E785 Hyperlipidemia, unspecified: Secondary | ICD-10-CM | POA: Diagnosis not present

## 2022-07-22 DIAGNOSIS — I10 Essential (primary) hypertension: Secondary | ICD-10-CM | POA: Diagnosis not present

## 2022-07-23 LAB — COMPLETE METABOLIC PANEL WITH GFR
AG Ratio: 1.7 (calc) (ref 1.0–2.5)
ALT: 16 U/L (ref 6–29)
AST: 19 U/L (ref 10–35)
Albumin: 4.7 g/dL (ref 3.6–5.1)
Alkaline phosphatase (APISO): 58 U/L (ref 37–153)
BUN: 13 mg/dL (ref 7–25)
CO2: 29 mmol/L (ref 20–32)
Calcium: 10.6 mg/dL — ABNORMAL HIGH (ref 8.6–10.4)
Chloride: 96 mmol/L — ABNORMAL LOW (ref 98–110)
Creat: 0.8 mg/dL (ref 0.50–1.05)
Globulin: 2.7 g/dL (calc) (ref 1.9–3.7)
Glucose, Bld: 96 mg/dL (ref 65–99)
Potassium: 4.2 mmol/L (ref 3.5–5.3)
Sodium: 134 mmol/L — ABNORMAL LOW (ref 135–146)
Total Bilirubin: 1.2 mg/dL (ref 0.2–1.2)
Total Protein: 7.4 g/dL (ref 6.1–8.1)
eGFR: 80 mL/min/{1.73_m2} (ref >=60)

## 2022-07-23 LAB — CBC
HCT: 44.4 % (ref 35.0–45.0)
Hemoglobin: 15.2 g/dL (ref 11.7–15.5)
MCH: 30.6 pg (ref 27.0–33.0)
MCHC: 34.2 g/dL (ref 32.0–36.0)
MCV: 89.5 fL (ref 80.0–100.0)
MPV: 9.7 fL (ref 7.5–12.5)
Platelets: 323 10*3/uL (ref 140–400)
RBC: 4.96 10*6/uL (ref 3.80–5.10)
RDW: 12.2 % (ref 11.0–15.0)
WBC: 7 10*3/uL (ref 3.8–10.8)

## 2022-07-23 LAB — LIPID PANEL W/REFLEX DIRECT LDL
Cholesterol: 230 mg/dL — ABNORMAL HIGH (ref <200)
HDL: 77 mg/dL (ref >=50)
LDL Cholesterol (Calc): 126 mg/dL (calc) — ABNORMAL HIGH
Non-HDL Cholesterol (Calc): 153 mg/dL (calc) — ABNORMAL HIGH (ref <130)
Total CHOL/HDL Ratio: 3 (calc) (ref <5.0)
Triglycerides: 159 mg/dL — ABNORMAL HIGH (ref <150)

## 2022-07-23 NOTE — Progress Notes (Signed)
Hi Jalia, your cholesterol went up from last couple of year. Continue to work on [employer] and exercise. Sodium and calcium are normal.  Blood count is normal.

## 2022-07-25 ENCOUNTER — Encounter: Payer: Self-pay | Admitting: Family Medicine

## 2022-07-25 ENCOUNTER — Other Ambulatory Visit: Payer: Self-pay | Admitting: Family Medicine

## 2022-07-25 ENCOUNTER — Ambulatory Visit: Payer: Medicare Other | Admitting: Family Medicine

## 2022-07-25 VITALS — BP 154/96 | HR 75 | Ht 65.0 in | Wt 163.0 lb

## 2022-07-25 DIAGNOSIS — Z7689 Persons encountering health services in other specified circumstances: Secondary | ICD-10-CM

## 2022-07-25 DIAGNOSIS — I10 Essential (primary) hypertension: Secondary | ICD-10-CM | POA: Diagnosis not present

## 2022-07-25 DIAGNOSIS — M19042 Primary osteoarthritis, left hand: Secondary | ICD-10-CM | POA: Diagnosis not present

## 2022-07-25 DIAGNOSIS — Z6827 Body mass index (BMI) 27.0-27.9, adult: Secondary | ICD-10-CM

## 2022-07-25 DIAGNOSIS — J45909 Unspecified asthma, uncomplicated: Secondary | ICD-10-CM

## 2022-07-25 DIAGNOSIS — Z1159 Encounter for screening for other viral diseases: Secondary | ICD-10-CM

## 2022-07-25 DIAGNOSIS — Z1231 Encounter for screening mammogram for malignant neoplasm of breast: Secondary | ICD-10-CM

## 2022-07-25 DIAGNOSIS — M8580 Other specified disorders of bone density and structure, unspecified site: Secondary | ICD-10-CM | POA: Diagnosis not present

## 2022-07-25 DIAGNOSIS — Z114 Encounter for screening for human immunodeficiency virus [HIV]: Secondary | ICD-10-CM

## 2022-07-25 DIAGNOSIS — Z780 Asymptomatic menopausal state: Secondary | ICD-10-CM

## 2022-07-25 MED ORDER — SEMAGLUTIDE-WEIGHT MANAGEMENT 0.5 MG/0.5ML ~~LOC~~ SOAJ
0.5000 mg | SUBCUTANEOUS | 0 refills | Status: DC
Start: 2022-08-23 — End: 2022-09-19

## 2022-07-25 MED ORDER — SEMAGLUTIDE-WEIGHT MANAGEMENT 1 MG/0.5ML ~~LOC~~ SOAJ
1.0000 mg | SUBCUTANEOUS | 0 refills | Status: AC
Start: 2022-09-21 — End: 2022-10-19

## 2022-07-25 MED ORDER — SEMAGLUTIDE-WEIGHT MANAGEMENT 0.25 MG/0.5ML ~~LOC~~ SOAJ
0.2500 mg | SUBCUTANEOUS | 0 refills | Status: AC
Start: 2022-07-25 — End: 2022-08-22

## 2022-07-25 MED ORDER — ALBUTEROL SULFATE HFA 108 (90 BASE) MCG/ACT IN AERS
INHALATION_SPRAY | RESPIRATORY_TRACT | 99 refills | Status: AC
Start: 2022-07-25 — End: ?

## 2022-07-25 NOTE — Assessment & Plan Note (Signed)
Amanda Moyer elevated today though it looked great at her last appointment so organ to have her come back in 2 weeks for nurse visit to recheck if it still elevated at that time then we will need to start medication back.

## 2022-07-25 NOTE — Progress Notes (Signed)
Established Patient Office Visit  Subjective   Patient ID: Amanda Moyer, female    DOB: March 11, 1951  Age: 72 y.o. MRN: [id number]  Chief Complaint  Patient presents with   Follow-up    Discuss weight loss medication     HPI  Hypertension- Pt denies chest pain, SOB, dizziness, or heart palpitations.  Taking meds as directed w/o problems.  Denies medication side effects.    Encounter for weight management-she never was able to pick up the Qsymia.  It required prior authorization and she said she called the office and left a message but never heard back from anybody.  And she did not pursue it again at that point.  She is here today because she would like to consider switching to one of the injectables.    ROS    Objective:     BP (!) 154/96   Pulse 75   Ht 5\' 5"  (1.651 m)   Wt 163 lb (73.9 kg)   SpO2 97%   BMI 27.12 kg/m    Physical Exam Vitals and nursing note reviewed.  Constitutional:      Appearance: She is well-developed.  HENT:     Head: Normocephalic and atraumatic.  Cardiovascular:     Rate and Rhythm: Normal rate and regular rhythm.     Heart sounds: Normal heart sounds.  Pulmonary:     Effort: Pulmonary effort is normal.     Breath sounds: Normal breath sounds.  Skin:    General: Skin is warm and dry.  Neurological:     Mental Status: She is alert and oriented to person, place, and time.  Psychiatric:        Behavior: Behavior normal.      No results found for any visits on 07/25/22.    The 10-year ASCVD risk score (Arnett DK, et al., 2019) is: 15.6%    Assessment & Plan:   Problem List Items Addressed This Visit       Cardiovascular and Mediastinum   Essential hypertension    Asher elevated today though it looked great at her last appointment so organ to have her come back in 2 weeks for nurse visit to recheck if it still elevated at that time then we will need to start medication back.        Respiratory   Intrinsic asthma    Relevant Medications   albuterol (VENTOLIN HFA) 108 (90 Base) MCG/ACT inhaler     Musculoskeletal and Integument   Osteopenia    For updated bone density.  Order placed.        Other   Encounter for weight management - Primary    He is very interested in weight loss she is gained a lot since she had her foot surgery and stress had a hard time getting it off.  She feels like in general she has had more joint aches and pain.  Her cholesterol has gone up and blood pressures.  Visit #: 2 Starting Weight: 159 lbs   Current weight:163 lbs Previous weight: 159 lbs  Change in weight: Up 4 lb s Goal weight:135 lbs  Dietary goals: work on more veggie and portion control  Exercise goals: setting weekly exercise goal.  Medication: Zepbound, will need PA Follow-up and referrals: 4 weeks and will adjust dose at that time        Relevant Medications   Semaglutide-Weight Management 0.25 MG/0.5ML SOAJ   Semaglutide-Weight Management 0.5 MG/0.5ML SOAJ (Start on 08/23/2022)   Semaglutide-Weight Management  1 MG/0.5ML SOAJ (Start on 09/21/2022)   Other Visit Diagnoses     Post-menopausal       Relevant Orders   DG Bone Density   Screening mammogram for breast cancer       Relevant Orders   MM 3D SCREENING MAMMOGRAM BILATERAL BREAST   Need for hepatitis C screening test       Screening for HIV without presence of risk factors       Arthritis of left hand       BMI 27.0-27.9,adult          Arthritis-she says maybe 8 or 9 years ago she actually saw a rheumatologist because she had abnormal lab levels and she was suspect for rheumatoid at that time the rheumatologist did not feel like she probably had rheumatoid they felt like it was osteo.  But she feels like it has been getting a little worse in her hands particularly her left hand lately.  For to recheck labs and get plain film x-rays for further workup.  She says she will think about it and let me know.    Return in about 6 weeks (around  09/05/2022) for New start medication.    Nani Gasser, MD

## 2022-07-25 NOTE — Assessment & Plan Note (Addendum)
He is very interested in weight loss she is gained a lot since she had her foot surgery and stress had a hard time getting it off.  She feels like in general she has had more joint aches and pain.  Her cholesterol has gone up and blood pressures.  Visit #: 2 Starting Weight: 159 lbs   Current weight:163 lbs Previous weight: 159 lbs  Change in weight: Up 4 lb s Goal weight:135 lbs  Dietary goals: work on more veggie and portion control  Exercise goals: setting weekly exercise goal.  Medication: Zepbound, will need PA Follow-up and referrals: 4 weeks and will adjust dose at that time

## 2022-07-25 NOTE — Telephone Encounter (Signed)
Please initiate PA. Thanks.

## 2022-07-25 NOTE — Assessment & Plan Note (Signed)
For updated bone density.  Order placed.

## 2022-07-25 NOTE — Patient Instructions (Signed)
Please schedule a nurse visit for BP check in 2 weeks

## 2022-07-26 ENCOUNTER — Telehealth: Payer: Self-pay

## 2022-07-26 NOTE — Patient Outreach (Signed)
  Care Coordination   07/26/2022 Name: Amanda Moyer MRN: [id number] DOB: Dec 16, 1950   Care Coordination Outreach Attempts:  An unsuccessful telephone outreach was attempted today to offer the patient information about available care coordination services.  Follow Up Plan:  Additional outreach attempts will be made to offer the patient care coordination information and services.   Encounter Outcome:  No Answer   Care Coordination Interventions:  No, not indicated    Kathyrn Sheriff, RN, MSN, BSN, CCM [facility] Care Coordinator [phone]

## 2022-07-26 NOTE — Addendum Note (Signed)
Addended by: Nani Gasser D on: 07/26/2022 12:58 PM   Modules accepted: Level of Service

## 2022-08-05 ENCOUNTER — Encounter: Payer: Self-pay | Admitting: Family Medicine

## 2022-08-06 ENCOUNTER — Telehealth: Payer: Self-pay

## 2022-08-06 ENCOUNTER — Ambulatory Visit (INDEPENDENT_AMBULATORY_CARE_PROVIDER_SITE_OTHER): Payer: Medicare Other | Admitting: Family Medicine

## 2022-08-06 VITALS — BP 149/78 | HR 86 | Ht 65.0 in

## 2022-08-06 DIAGNOSIS — I10 Essential (primary) hypertension: Secondary | ICD-10-CM | POA: Diagnosis not present

## 2022-08-06 MED ORDER — OLMESARTAN-AMLODIPINE-HCTZ 40-10-12.5 MG PO TABS: 1.0000 | ORAL_TABLET | Freq: Every day | ORAL | 1 refills | Status: DC

## 2022-08-06 MED ORDER — OLMESARTAN-AMLODIPINE-HCTZ 40-5-25 MG PO TABS
1.0000 | ORAL_TABLET | Freq: Every day | ORAL | 1 refills | Status: DC
Start: 2022-08-06 — End: 2022-09-19

## 2022-08-06 NOTE — Progress Notes (Signed)
   Established Patient Office Visit  Subjective   Patient ID: Amanda Moyer, female    DOB: Apr 27, 1950  Age: 72 y.o. MRN: [id number]  Chief Complaint  Patient presents with   Hypertension    BP check nurse visit.     HPI  Hypertension- BP check nurse visit. Patient denies chest pain, shortness of breath, palpitations,  dizziness or problems with medicaiton.   ROS    Objective:     BP (!) 149/78   Pulse 86   Ht 5\' 5"  (1.651 m)   SpO2 100%   BMI 27.12 kg/m    Physical Exam   No results found for any visits on 08/06/22.    The 10-year ASCVD risk score (Arnett DK, et al., 2019) is: 14.6%    Assessment & Plan:  BP check nurse visit- initial reading = 145/80. Second reading = 149/78. Per Dr. Linford Arnold will be adding another medication. Patient should follow up in 4 to 6 weeks with provider.  Problem List Items Addressed This Visit       Cardiovascular and Mediastinum   Essential hypertension - Primary    Return for Follow up in 4 to 6 weeks after starting new BP medicaiton with provider .    Elizabeth Palau, LPN

## 2022-08-06 NOTE — Patient Instructions (Signed)
Follow up in 4 to 6 weeks after starting new BP medicaiton with provider

## 2022-08-06 NOTE — Progress Notes (Signed)
Pretension-uncontrolled.  Will discontinue Diovan HCTZ and switch to Tribenzor which will add the amlodipine component to the medication.   Tribenzor sent.

## 2022-08-06 NOTE — Progress Notes (Signed)
Patient informed. She will stop the diovan/hctz and start the tribenzor.

## 2022-08-06 NOTE — Telephone Encounter (Addendum)
Initiated Prior authorization ZOX:WRUEAV 0.25MG /0.5ML auto-injectors Via: Covermymeds Case/Key:[id number] Status: denied  as of 08/06/22 Reason:We denied this request under Medicare Part D because: Your plan's Medicare Part D drug plan cannot cover  drugs used for loss of appetite (anorexia), weight loss, or weight gain. Your Medicare Part D drug plan was  asked to cover the requested drug. The requested drug is used for loss of appetite (anorexia), weight loss, or  to help you gain weight. Under section 1927(d)(2) of the Social Security Act, drugs used for loss of appetite  (anorexia), weight loss, or weight gain are excluded from coverage under the Medicare Part D drug benefit.  Since the requested indication is excluded from Medicare Part D coverage, the request for coverage under  your Medicare Part D benefit is denied Notified Pt via: Mychart

## 2022-08-07 DIAGNOSIS — M79671 Pain in right foot: Secondary | ICD-10-CM | POA: Diagnosis not present

## 2022-08-08 ENCOUNTER — Ambulatory Visit: Payer: BLUE CROSS/BLUE SHIELD

## 2022-08-14 ENCOUNTER — Other Ambulatory Visit: Payer: Self-pay | Admitting: Family Medicine

## 2022-08-14 DIAGNOSIS — I10 Essential (primary) hypertension: Secondary | ICD-10-CM

## 2022-08-15 ENCOUNTER — Other Ambulatory Visit: Payer: Self-pay | Admitting: Family Medicine

## 2022-08-15 DIAGNOSIS — Z7689 Persons encountering health services in other specified circumstances: Secondary | ICD-10-CM

## 2022-08-20 ENCOUNTER — Encounter: Payer: Self-pay | Admitting: Family Medicine

## 2022-08-27 DIAGNOSIS — Z1231 Encounter for screening mammogram for malignant neoplasm of breast: Secondary | ICD-10-CM | POA: Diagnosis not present

## 2022-08-27 LAB — HM MAMMOGRAPHY

## 2022-08-28 ENCOUNTER — Other Ambulatory Visit: Payer: Self-pay | Admitting: Family Medicine

## 2022-08-28 DIAGNOSIS — I10 Essential (primary) hypertension: Secondary | ICD-10-CM

## 2022-08-30 DIAGNOSIS — Z780 Asymptomatic menopausal state: Secondary | ICD-10-CM | POA: Diagnosis not present

## 2022-08-30 LAB — HM DEXA SCAN

## 2022-09-10 ENCOUNTER — Encounter: Payer: Self-pay | Admitting: Family Medicine

## 2022-09-17 ENCOUNTER — Encounter: Payer: Self-pay | Admitting: Family Medicine

## 2022-09-19 ENCOUNTER — Ambulatory Visit: Payer: Medicare Other | Admitting: Family Medicine

## 2022-09-19 ENCOUNTER — Encounter: Payer: Self-pay | Admitting: Family Medicine

## 2022-09-19 VITALS — BP 126/82 | HR 82 | Ht 65.0 in | Wt 158.0 lb

## 2022-09-19 DIAGNOSIS — Z7689 Persons encountering health services in other specified circumstances: Secondary | ICD-10-CM | POA: Diagnosis not present

## 2022-09-19 DIAGNOSIS — I10 Essential (primary) hypertension: Secondary | ICD-10-CM | POA: Diagnosis not present

## 2022-09-19 MED ORDER — OLMESARTAN-AMLODIPINE-HCTZ 20-5-12.5 MG PO TABS
1.0000 | ORAL_TABLET | Freq: Every day | ORAL | 1 refills | Status: DC
Start: 2022-09-19 — End: 2022-10-15

## 2022-09-19 NOTE — Assessment & Plan Note (Addendum)
Visit #: 3 Starting Weight: 163 lbs   Current weight:158 lbs Previous weight: 163 lbs  Change in weight: Down 5 lb s Goal weight:135 lbs  Dietary goals: work on more veggie and portion control  Exercise goals: Walking 5 to 6 days/week. Medication: will inc to  [facility] 1mg  Follow-up and referrals: 6 weeks

## 2022-09-19 NOTE — Assessment & Plan Note (Signed)
Blood pressures have been running a little bit lower at home under 110.  So we discussed options including decreasing the Tribenzor.  Eventually she like to go just go back to plain valsartan/HCT.  She did well on that for a long time.  I think if she continues to lose weight and continues to work on healthy food choices and regular exercise that blood pressure will come down beautifully and will be able to switch back.

## 2022-09-19 NOTE — Progress Notes (Addendum)
   Established Patient Office Visit  Subjective   Patient ID: Amanda Moyer, female    DOB: June 29, 1950  Age: 72 y.o. MRN: [id number]  Chief Complaint  Patient presents with   Weight Check    HPI  Hypertension- Pt denies chest pain, SOB, dizziness, or heart palpitations.  Taking meds as directed w/o problems.  Has noticed that occasionally her blood pressure tends to drop under 110 and when it does she feels a little bit more tired.  Follow-up weight management. -Currently on Wegovy 0.5 mg and tolerating it well.  She is getting ready to move up to the 1 mg she has not noticed any significant side effects.  She has noticed a little bit decrease in appetite.  She is been walking 5 to 6 days/week on average.  This past week she was actually able to get 7.      ROS    Objective:     BP 126/82   Pulse 82   Ht 5\' 5"  (1.651 m)   Wt 158 lb (71.7 kg)   SpO2 100%   BMI 26.29 kg/m    Physical Exam Vitals and nursing note reviewed.  Constitutional:      Appearance: She is well-developed.  HENT:     Head: Normocephalic and atraumatic.  Cardiovascular:     Rate and Rhythm: Normal rate and regular rhythm.     Heart sounds: Normal heart sounds.  Pulmonary:     Effort: Pulmonary effort is normal.     Breath sounds: Normal breath sounds.  Skin:    General: Skin is warm and dry.  Neurological:     Mental Status: She is alert and oriented to person, place, and time.  Psychiatric:        Behavior: Behavior normal.      No results found for any visits on 09/19/22.    The 10-year ASCVD risk score (Arnett DK, et al., 2019) is: 9%    Assessment & Plan:   Problem List Items Addressed This Visit       Cardiovascular and Mediastinum   Essential hypertension    Blood pressures have been running a little bit lower at home under 110.  So we discussed options including decreasing the Tribenzor.  Eventually she like to go just go back to plain valsartan/HCT.  She did well on  that for a long time.  I think if she continues to lose weight and continues to work on healthy food choices and regular exercise that blood pressure will come down beautifully and will be able to switch back.      Relevant Medications   Olmesartan-amLODIPine-HCTZ 20-5-12.5 MG TABS     Other   Encounter for weight management - Primary    Visit #: 3 Starting Weight: 163 lbs   Current weight:158 lbs Previous weight: 163 lbs  Change in weight: Down 5 lb s Goal weight:135 lbs  Dietary goals: work on more veggie and portion control  Exercise goals: Walking 5 to 6 days/week. Medication: will inc to  [facility] 1mg  Follow-up and referrals: 6 weeks        Return in about 6 weeks (around 10/31/2022) for Wegiht mgt adn BP check .    Nani Gasser, MD

## 2022-10-15 ENCOUNTER — Encounter: Payer: Self-pay | Admitting: Family Medicine

## 2022-10-15 ENCOUNTER — Other Ambulatory Visit: Payer: Self-pay | Admitting: Family Medicine

## 2022-10-15 DIAGNOSIS — I10 Essential (primary) hypertension: Secondary | ICD-10-CM

## 2022-10-31 ENCOUNTER — Encounter: Payer: Self-pay | Admitting: Family Medicine

## 2022-10-31 ENCOUNTER — Ambulatory Visit: Payer: Medicare Other | Admitting: Family Medicine

## 2022-10-31 VITALS — BP 125/83 | HR 81 | Ht 65.0 in | Wt 151.0 lb

## 2022-10-31 DIAGNOSIS — I10 Essential (primary) hypertension: Secondary | ICD-10-CM | POA: Diagnosis not present

## 2022-10-31 DIAGNOSIS — Z7689 Persons encountering health services in other specified circumstances: Secondary | ICD-10-CM

## 2022-10-31 MED ORDER — WEGOVY 1 MG/0.5ML ~~LOC~~ SOAJ: 1.0000 mg | SUBCUTANEOUS | 1 refills | Status: DC

## 2022-10-31 MED ORDER — LINACLOTIDE 290 MCG PO CAPS: 290.0000 ug | ORAL_CAPSULE | Freq: Every day | ORAL | 1 refills | Status: DC

## 2022-10-31 NOTE — Assessment & Plan Note (Signed)
Pressure looks fantastic today.  Continue to monitor especially as she loses weight.

## 2022-10-31 NOTE — Progress Notes (Signed)
   Established Patient Office Visit  Subjective   Patient ID: Amanda Moyer, female    DOB: [birth year]  Age: 72 y.o. MRN: [id number]  Chief Complaint  Patient presents with   Weight Check    HPI  Here today for weight management.  Overall she is doing really well tolerating the 1 mg dose well.  Has noticed a definite decrease in appetite.  Still continuing to work on [employer] choices.  Still working out 6 days/week.  She did visit her daughter and grandkids for about a week and did not exercises consistently then though she was pretty physically active.  Like to get down to between 135 and 139 pounds.  Has noticed that her knee pain and back pain is actually been much better since she has lost some weight.  Noticed at the slowness in her gut but is managing that.    ROS    Objective:     BP 125/83   Pulse 81   Ht 5\' 5"  (1.651 m)   Wt 151 lb (68.5 kg)   SpO2 99%   BMI 25.13 kg/m    Physical Exam Vitals and nursing note reviewed.  Constitutional:      Appearance: She is well-developed.  HENT:     Head: Normocephalic and atraumatic.  Cardiovascular:     Rate and Rhythm: Normal rate and regular rhythm.     Heart sounds: Normal heart sounds.  Pulmonary:     Effort: Pulmonary effort is normal.     Breath sounds: Normal breath sounds.  Skin:    General: Skin is warm and dry.  Neurological:     Mental Status: She is alert and oriented to person, place, and time.  Psychiatric:        Behavior: Behavior normal.      No results found for any visits on 10/31/22.    The 10-year ASCVD risk score (Arnett DK, et al., 2019) is: 8.9%    Assessment & Plan:   Problem List Items Addressed This Visit       Cardiovascular and Mediastinum   Essential hypertension    Pressure looks fantastic today.  Continue to monitor especially as she loses weight.        Other   Encounter for weight management - Primary    Visit #: 4 Starting Weight: 163 lbs   Current  weight:151 lbs Previous weight: 158 lbs  Change in weight: Down 7 lb s Goal weight:135-139 lbs  Dietary goals: work on more veggie and  decreasing carbs  Exercise goals: Walking 5 to 6 days/week. Medication: will i continue with Wegovy 1 mg as she is doing really well on it currently.  We did discuss possibly going up to 2 mg in the next couple of months or if the 1 mg is not available at the pharmacy but they do happen to have the other 1 in stock. Follow-up and referrals: 8weeks          Return in about 8 weeks (around 12/26/2022) for Weight management .    Nani Gasser, MD

## 2022-10-31 NOTE — Assessment & Plan Note (Addendum)
Visit #: 4 Starting Weight: 163 lbs   Current weight:151 lbs Previous weight: 158 lbs  Change in weight: Down 7 lb s Goal weight:135-139 lbs  Dietary goals: work on more veggie and  decreasing carbs  Exercise goals: Walking 5 to 6 days/week. Medication: will i continue with Wegovy 1 mg as she is doing really well on it currently.  We did discuss possibly going up to 2 mg in the next couple of months or if the 1 mg is not available at the pharmacy but they do happen to have the other 1 in stock. Follow-up and referrals: 8weeks

## 2022-11-29 ENCOUNTER — Encounter: Payer: Self-pay | Admitting: Family Medicine

## 2022-11-29 NOTE — Telephone Encounter (Signed)
Patient requesting to increase strength of Wegovy for next refill Last written Wegovy 1mg   10/31/2022 Last OV 10/31/2022 Upcoming appt 12/26/2022

## 2022-12-03 ENCOUNTER — Other Ambulatory Visit: Payer: Self-pay | Admitting: Family Medicine

## 2022-12-03 MED ORDER — WEGOVY 1.7 MG/0.75ML ~~LOC~~ SOAJ: 1.7000 mg | SUBCUTANEOUS | 1 refills | Status: DC

## 2022-12-03 NOTE — Telephone Encounter (Signed)
Meds ordered this encounter  Medications   DISCONTD: Semaglutide-Weight Management (WEGOVY) 1.7 MG/0.75ML SOAJ    Sig: Inject 1.7 mg into the skin every 7 (seven) days.    Dispense:  3 mL    Refill:  1   Semaglutide-Weight Management (WEGOVY) 1.7 MG/0.75ML SOAJ    Sig: Inject 1.7 mg into the skin every 7 (seven) days.    Dispense:  3 mL    Refill:  1   Had to send again to correct pharmacy

## 2022-12-09 ENCOUNTER — Ambulatory Visit (INDEPENDENT_AMBULATORY_CARE_PROVIDER_SITE_OTHER): Payer: Medicare Other | Admitting: Family Medicine

## 2022-12-09 DIAGNOSIS — Z0000 Encounter for general adult medical examination without abnormal findings: Secondary | ICD-10-CM

## 2022-12-09 NOTE — Progress Notes (Signed)
MEDICARE ANNUAL WELLNESS VISIT  12/09/2022  Telephone Visit Disclaimer This Medicare AWV was conducted by telephone due to national recommendations for restrictions regarding the COVID-19 Pandemic (e.g. social distancing).  I verified, using two identifiers, that I am speaking with Amanda Moyer or their authorized healthcare agent. I discussed the limitations, risks, security, and privacy concerns of performing an evaluation and management service by telephone and the potential availability of an in-person appointment in the future. The patient expressed understanding and agreed to proceed.  Location of Patient: Home Location of Provider (nurse):  In the office.  Subjective:    Amanda Moyer is a 72 y.o. female patient of Metheney, Barbarann Ehlers, MD who had a Medicare Annual Wellness Visit today via telephone. Amanda Moyer is Retired and lives with their spouse. she has 2 children. she reports that she is socially active and does interact with friends/family regularly. she is moderately physically active and enjoys spending time with the grandchildren, reading, walking and traveling..  Patient Care Team: Agapito Games, MD as PCP - General (Family Medicine) Maris Berger, MD as Referring Physician (Dermatology)     12/09/2022    2:02 PM 12/03/2021    2:17 PM 02/28/2020   10:43 AM 12/11/2017    9:31 AM 05/12/2014    3:30 PM  Advanced Directives  Does Patient Have a Medical Advance Directive? Yes Yes Yes Yes Yes  Type of Advance Directive Living will Living will Living will;Healthcare Power of [employer] Power of [place];Living will Living will  Does patient want to make changes to medical advance directive? No - Patient declined No - Patient declined  No - Patient declined No - Patient declined  Copy of Healthcare Power of Attorney in Chart?   No - copy requested No - copy requested No - copy requested    Hospital Utilization Over the Past 12 Months: # of  hospitalizations or ER visits: 0 # of surgeries: 1  Review of Systems    Patient reports that her overall health is better compared to last year.  History obtained from chart review and the patient  Patient Reported Readings (BP, Pulse, CBG, Weight, etc) none Per patient no change in vitals since last visit, unable to obtain new vitals due to telehealth visit  Pain Assessment Pain : No/denies pain     Current Medications & Allergies (verified) Allergies as of 12/09/2022       Reactions   Gramineae Pollens    Irbesartan-hydrochlorothiazide    Other reaction(s): Other (See Comments) Elevated blood pressure.   Zostavax [zoster Vaccine Live] Other (See Comments)   Caused joint pain        Medication List        Accurate as of December 09, 2022  2:23 PM. If you have any questions, ask your nurse or doctor.          albuterol 108 (90 Base) MCG/ACT inhaler Commonly known as: Ventolin HFA INHALE 2 PUFFS INTO THE LUNGS EVERY 6 HOURS AS NEEDED FOR SHORTNESS OF BREATH   BIOTIN PO Take by mouth.   CALCIUM 600 PO Take by mouth.   cholecalciferol 1000 units tablet Commonly known as: VITAMIN D Take 1,000 Units by mouth daily.   fluticasone 50 MCG/ACT nasal spray Commonly known as: FLONASE Place 1 spray into both nostrils 2 (two) times daily.   linaclotide 290 MCG Caps capsule Commonly known as: Linzess Take 1 capsule (290 mcg total) by mouth daily before breakfast.   loratadine 10 MG  tablet Commonly known as: CLARITIN Take 10 mg by mouth daily.   MAGNESIUM PO Take by mouth.   METAMUCIL MULTIHEALTH FIBER PO Take by mouth daily.   Olmesartan-amLODIPine-HCTZ 20-5-12.5 MG Tabs TAKE 1 TABLET BY MOUTH EVERY DAY   thiamine 50 MG tablet Commonly known as: VITAMIN B-1 Take 50 mg by mouth daily.   Wegovy 1.7 MG/0.75ML Soaj Generic drug: Semaglutide-Weight Management INJECT 1.7 MG INTO THE SKIN EVERY 7 (SEVEN) DAYS.        History (reviewed): Past  Medical History:  Diagnosis Date   Retinal detachment    both eyes    Past Surgical History:  Procedure Laterality Date   arm fracture  1966   no hardware   CHOLECYSTECTOMY  1990   RETINAL DETACHMENT SURGERY Left 1996   TONSILLECTOMY  1960   Family History  Problem Relation Age of Onset   Prostate cancer Paternal Grandfather    Depression Maternal Grandmother    Hypertension Mother    Alzheimer's disease Mother    Parkinson's disease Mother    Hypertension Sister    Luiz Blare' disease Daughter    Raynaud syndrome Daughter    Social History   Socioeconomic History   Marital status: Married    Spouse name: Amanda Moyer   Number of children: 2   Years of education: BS Sociology   Highest education level: Bachelor's degree (e.g., BA, AB, BS)  Occupational History   Occupation: Homemaker  Tobacco Use   Smoking status: Never   Smokeless tobacco: Never  Vaping Use   Vaping status: Never Used  Substance and Sexual Activity   Alcohol use: Yes    Alcohol/week: 2.0 standard drinks of alcohol    Types: 2 Glasses of wine per week   Drug use: No   Sexual activity: Yes    Partners: Male    Birth control/protection: None  Other Topics Concern   Not on file  Social History Narrative   Lives with her husband. She has two children. She enjoys spending time with the grandchildren, reading, walking and traveling.   Social Determinants of Health   Financial Resource Strain: Low Risk  (12/09/2022)   Overall Financial Resource Strain (CARDIA)    Difficulty of Paying Living Expenses: Not hard at all  Food Insecurity: No Food Insecurity (12/09/2022)   Hunger Vital Sign    Worried About Running Out of Food in the Last Year: Never true    Ran Out of Food in the Last Year: Never true  Transportation Needs: No Transportation Needs (12/09/2022)   PRAPARE - Administrator, Civil Service (Medical): No    Lack of Transportation (Non-Medical): No  Physical Activity: Sufficiently Active  (12/09/2022)   Exercise Vital Sign    Days of Exercise per Week: 4 days    Minutes of Exercise per Session: 60 min  Stress: No Stress Concern Present (12/09/2022)   [employer] of Occupational Health - Occupational Stress Questionnaire    Feeling of Stress : Not at all  Social Connections: Socially Integrated (12/09/2022)   Social Connection and Isolation Panel [NHANES]    Frequency of Communication with Friends and Family: More than three times a week    Frequency of Social Gatherings with Friends and Family: Once a week    Attends Religious Services: More than 4 times per year    Active Member of [employer] or Organizations: Yes    Attends Banker Meetings: More than 4 times per year    Marital Status:  Married    Activities of Daily Living    12/09/2022    2:11 PM  In your present state of health, do you have any difficulty performing the following activities:  Hearing? 0  Vision? 0  Difficulty concentrating or making decisions? 0  Walking or climbing stairs? 0  Dressing or bathing? 0  Doing errands, shopping? 0  Preparing Food and eating ? N  Using the Toilet? N  In the past six months, have you accidently leaked urine? N  Do you have problems with loss of bowel control? N  Managing your Medications? N  Managing your Finances? N  Housekeeping or managing your Housekeeping? N    Patient Education/ Literacy How often do you need to have someone help you when you read instructions, pamphlets, or other written materials from your doctor or pharmacy?: 1 - Never What is the last grade level you completed in school?: bachelor's degree  Exercise    Diet Patient reports consuming 2 meals a day and 1 snack(s) a day Patient reports that her primary diet is: Regular Patient reports that she does have regular access to food.   Depression Screen    12/09/2022    2:03 PM 07/25/2022    9:27 AM 04/08/2022   10:51 AM 12/03/2021    2:16 PM 02/28/2020   10:40 AM 02/28/2020    10:39 AM 05/01/2018    3:39 PM  PHQ 2/9 Scores  PHQ - 2 Score 0 0 1 0 0 0 1     Fall Risk    12/09/2022    2:03 PM 07/25/2022    9:27 AM 04/08/2022   10:51 AM 12/03/2021    2:16 PM 02/28/2020   10:40 AM  Fall Risk   Falls in the past year? 0 0 0 0 1  Number falls in past yr: 0 0 0 0 0  Injury with Fall? 0 0 0 0 1  Risk for fall due to : No Fall Risks No Fall Risks No Fall Risks No Fall Risks;History of fall(s) No Fall Risks  Follow up Falls evaluation completed Falls evaluation completed Falls evaluation completed Falls evaluation completed;Education provided Falls prevention discussed     Objective:  Amanda Moyer seemed alert and oriented and she participated appropriately during our telephone visit.  Blood Pressure Weight BMI  BP Readings from Last 3 Encounters:  10/31/22 125/83  09/19/22 126/82  08/06/22 (!) 149/78   Wt Readings from Last 3 Encounters:  10/31/22 151 lb (68.5 kg)  09/19/22 158 lb (71.7 kg)  07/25/22 163 lb (73.9 kg)   BMI Readings from Last 1 Encounters:  10/31/22 25.13 kg/m    *Unable to obtain current vital signs, weight, and BMI due to telephone visit type  Hearing/Vision  Athenea did not seem to have difficulty with hearing/understanding during the telephone conversation Reports that she has not had a formal eye exam by an eye care professional within the past year Reports that she has not had a formal hearing evaluation within the past year *Unable to fully assess hearing and vision during telephone visit type  Cognitive Function:    12/09/2022    2:14 PM 12/03/2021    2:20 PM 12/11/2017    9:30 AM  6CIT Screen  What Year? 0 points 0 points 0 points  What month? 0 points 0 points 0 points  What time? 0 points 0 points 0 points  Count back from 20 0 points 0 points 0 points  Months in reverse 0  points 0 points 0 points  Repeat phrase 0 points 0 points 0 points  Total Score 0 points 0 points 0 points   (Normal:0-7, Significant for  Dysfunction: >8)  Normal Cognitive Function Screening: Yes   Immunization & Health Maintenance Record Immunization History  Administered Date(s) Administered   Fluad Quad(high Dose 65+) 02/28/2020, 11/30/2020   Influenza,inj,Quad PF,6+ Mos 11/23/2013, 12/27/2014, 05/02/2016, 12/11/2017   Moderna Sars-Covid-2 Vaccination 05/07/2019, 06/05/2019, 03/20/2020, 11/30/2020   PPD Test 06/01/2004   Pneumococcal Conjugate-13 08/04/2017   Pneumococcal Polysaccharide-23 02/28/2020   Td (Adult) 06/01/2004   Tdap 06/02/2014   Zoster Recombinant(Shingrix) 08/27/2017    Health Maintenance  Topic Date Due   INFLUENZA VACCINE  12/24/2022 (Originally 10/24/2022)   COVID-19 Vaccine (5 - 2023-24 season) 12/25/2022 (Originally 11/24/2022)   MAMMOGRAM  08/27/2023   Medicare Annual Wellness (AWV)  12/09/2023   DTaP/Tdap/Td (2 - Td or Tdap) 06/01/2024   Colonoscopy  01/17/2025   DEXA SCAN  08/29/2032   Pneumonia Vaccine 26+ Years old  Completed   Hepatitis C Screening  Completed   HPV VACCINES  Aged Out   Zoster Vaccines- Shingrix  Discontinued       Assessment  This is a routine wellness examination for Amanda Moyer.  Health Maintenance: Due or Overdue There are no preventive care reminders to display for this patient.   Zareya Parris does not need a referral for Community Assistance: Care Management:   no Social Work:    no Prescription Assistance:  no Nutrition/Diabetes Education:  no   Plan:  Personalized Goals  Goals Addressed               This Visit's Progress     Patient Stated (pt-stated)        Patient stated that she would like to 10 more lbs and continue to keep active.       Personalized Health Maintenance & Screening Recommendations  Influenza vaccine Eye exam  Lung Cancer Screening Recommended: no (Low Dose CT Chest recommended if Age 72-80 years, 20 pack-year currently smoking OR have quit w/in past 15 years) Hepatitis C Screening recommended: no HIV  Screening recommended: no  Advanced Directives: Written information was not prepared per patient's request.  Referrals & Orders No orders of the defined types were placed in this encounter.   Follow-up Plan Follow-up with Agapito Games, MD as planned Schedule influenza and eye exam. Medicare wellness visit in one year.  Patient will access AVS on my chart.   I have personally reviewed and noted the following in the patient's chart:   Medical and social history Use of alcohol, tobacco or illicit drugs  Current medications and supplements Functional ability and status Nutritional status Physical activity Advanced directives List of other physicians Hospitalizations, surgeries, and ER visits in previous 12 months Vitals Screenings to include cognitive, depression, and falls Referrals and appointments  In addition, I have reviewed and discussed with Amanda Moyer certain preventive protocols, quality metrics, and best practice recommendations. A written personalized care plan for preventive services as well as general preventive health recommendations is available and can be mailed to the patient at her request.      Modesto Charon, RN BSN  12/09/2022

## 2022-12-09 NOTE — Patient Instructions (Addendum)
MEDICARE ANNUAL WELLNESS VISIT Health Maintenance Summary and Written Plan of Care  Amanda Moyer ,  Thank you for allowing me to perform your Medicare Annual Wellness Visit and for your ongoing commitment to your health.   Health Maintenance & Immunization History Health Maintenance  Topic Date Due   INFLUENZA VACCINE  12/24/2022 (Originally 10/24/2022)   COVID-19 Vaccine (5 - 2023-24 season) 12/25/2022 (Originally 11/24/2022)   MAMMOGRAM  08/27/2023   Medicare Annual Wellness (AWV)  12/09/2023   DTaP/Tdap/Td (2 - Td or Tdap) 06/01/2024   Colonoscopy  01/17/2025   DEXA SCAN  08/29/2032   Pneumonia Vaccine 56+ Years old  Completed   Hepatitis C Screening  Completed   HPV VACCINES  Aged Out   Zoster Vaccines- Shingrix  Discontinued   Immunization History  Administered Date(s) Administered   Fluad Quad(high Dose 65+) 02/28/2020, 11/30/2020   Influenza,inj,Quad PF,6+ Mos 11/23/2013, 12/27/2014, 05/02/2016, 12/11/2017   Moderna Sars-Covid-2 Vaccination 05/07/2019, 06/05/2019, 03/20/2020, 11/30/2020   PPD Test 06/01/2004   Pneumococcal Conjugate-13 08/04/2017   Pneumococcal Polysaccharide-23 02/28/2020   Td (Adult) 06/01/2004   Tdap 06/02/2014   Zoster Recombinant(Shingrix) 08/27/2017    These are the patient goals that we discussed:  Goals Addressed               This Visit's Progress     Patient Stated (pt-stated)        Patient stated that she would like to 10 more lbs and continue to keep active.         This is a list of Health Maintenance Items that are overdue or due now: Influenza vaccine Eye exam    Orders/Referrals Placed Today: No orders of the defined types were placed in this encounter.  (Contact our referral department at [phone] if you have not spoken with someone about your referral appointment within the next 5 days)    Follow-up Plan Follow-up with Agapito Games, MD as planned Schedule influenza and eye exam. Medicare wellness  visit in one year.  Patient will access AVS on my chart.      Health Maintenance, Female Adopting a healthy lifestyle and getting preventive care are important in promoting health and wellness. Ask your health care provider about: The right schedule for you to have regular tests and exams. Things you can do on your own to prevent diseases and keep yourself healthy. What should I know about diet, weight, and exercise? Eat a healthy diet  Eat a diet that includes plenty of vegetables, fruits, low-fat dairy products, and lean protein. Do not eat a lot of foods that are high in solid fats, added sugars, or sodium. Maintain a healthy weight Body mass index (BMI) is used to identify weight problems. It estimates body fat based on height and weight. Your health care provider can help determine your BMI and help you achieve or maintain a healthy weight. Get regular exercise Get regular exercise. This is one of the most important things you can do for your health. Most adults should: Exercise for at least 150 minutes each week. The exercise should increase your heart rate and make you sweat (moderate-intensity exercise). Do strengthening exercises at least twice a week. This is in addition to the moderate-intensity exercise. Spend less time sitting. Even light physical activity can be beneficial. Watch cholesterol and blood lipids Have your blood tested for lipids and cholesterol at 70 years of age, then have this test every 5 years. Have your cholesterol levels checked more often if: Your  lipid or cholesterol levels are high. You are older than 70 years of age. You are at high risk for heart disease. What should I know about cancer screening? Depending on your health history and family history, you may need to have cancer screening at various ages. This may include screening for: Breast cancer. Cervical cancer. Colorectal cancer. Skin cancer. Lung cancer. What should I know about heart  disease, diabetes, and high blood pressure? Blood pressure and heart disease High blood pressure causes heart disease and increases the risk of stroke. This is more likely to develop in people who have high blood pressure readings or are overweight. Have your blood pressure checked: Every 3-5 years if you are 30-29 years of age. Every year if you are 48 years old or older. Diabetes Have regular diabetes screenings. This checks your fasting blood sugar level. Have the screening done: Once every three years after age 60 if you are at a normal weight and have a low risk for diabetes. More often and at a younger age if you are overweight or have a high risk for diabetes. What should I know about preventing infection? Hepatitis B If you have a higher risk for hepatitis B, you should be screened for this virus. Talk with your health care provider to find out if you are at risk for hepatitis B infection. Hepatitis C Testing is recommended for: Everyone born from [birth year] through 1963. Anyone with known risk factors for hepatitis C. Sexually transmitted infections (STIs) Get screened for STIs, including gonorrhea and chlamydia, if: You are sexually active and are younger than 70 years of age. You are older than 70 years of age and your health care provider tells you that you are at risk for this type of infection. Your sexual activity has changed since you were last screened, and you are at increased risk for chlamydia or gonorrhea. Ask your health care provider if you are at risk. Ask your health care provider about whether you are at high risk for HIV. Your health care provider may recommend a prescription medicine to help prevent HIV infection. If you choose to take medicine to prevent HIV, you should first get tested for HIV. You should then be tested every 3 months for as long as you are taking the medicine. Pregnancy If you are about to stop having your period (premenopausal) and you may become  pregnant, seek counseling before you get pregnant. Take 400 to 800 micrograms (mcg) of folic acid every day if you become pregnant. Ask for birth control (contraception) if you want to prevent pregnancy. Osteoporosis and menopause Osteoporosis is a disease in which the bones lose minerals and strength with aging. This can result in bone fractures. If you are 95 years old or older, or if you are at risk for osteoporosis and fractures, ask your health care provider if you should: Be screened for bone loss. Take a calcium or vitamin D supplement to lower your risk of fractures. Be given hormone replacement therapy (HRT) to treat symptoms of menopause. Follow these instructions at home: Alcohol use Do not drink alcohol if: Your health care provider tells you not to drink. You are pregnant, may be pregnant, or are planning to become pregnant. If you drink alcohol: Limit how much you have to: 0-1 drink a day. Know how much alcohol is in your drink. In the [place] one drink equals one 12 oz bottle of beer (355 mL), one 5 oz glass of wine (148 mL), or one 1  oz glass of hard liquor (44 mL). Lifestyle Do not use any products that contain nicotine or tobacco. These products include cigarettes, chewing tobacco, and vaping devices, such as e-cigarettes. If you need help quitting, ask your health care provider. Do not use street drugs. Do not share needles. Ask your health care provider for help if you need support or information about quitting drugs. General instructions Schedule regular health, dental, and eye exams. Stay current with your vaccines. Tell your health care provider if: You often feel depressed. You have ever been abused or do not feel safe at home. Summary Adopting a healthy lifestyle and getting preventive care are important in promoting health and wellness. Follow your health care provider's instructions about healthy diet, exercising, and getting tested or screened for  diseases. Follow your health care provider's instructions on monitoring your cholesterol and blood pressure. This information is not intended to replace advice given to you by your health care provider. Make sure you discuss any questions you have with your health care provider. Document Revised: 07/31/2020 Document Reviewed: 07/31/2020 Elsevier Patient Education  2024 [employer].

## 2022-12-26 ENCOUNTER — Encounter: Payer: Self-pay | Admitting: Family Medicine

## 2022-12-26 ENCOUNTER — Ambulatory Visit (INDEPENDENT_AMBULATORY_CARE_PROVIDER_SITE_OTHER): Payer: Medicare Other | Admitting: Family Medicine

## 2022-12-26 VITALS — BP 113/87 | HR 85 | Ht 65.0 in | Wt 142.0 lb

## 2022-12-26 DIAGNOSIS — I10 Essential (primary) hypertension: Secondary | ICD-10-CM

## 2022-12-26 DIAGNOSIS — H6993 Unspecified Eustachian tube disorder, bilateral: Secondary | ICD-10-CM | POA: Diagnosis not present

## 2022-12-26 DIAGNOSIS — Z7689 Persons encountering health services in other specified circumstances: Secondary | ICD-10-CM | POA: Diagnosis not present

## 2022-12-26 NOTE — Assessment & Plan Note (Addendum)
Visit #: 5 Starting Weight: 163 lbs   Current weight: 142 lbs  Previous weight: 151 lbs  Change in weight: down 9 lbs  Goal weight:135-139 lbs  Dietary goals: work on more veggie and  decreasing carbs  Exercise goals: Walking 5 to 6 days/week. Medication: continue with Wegovy 1.7 mg as she is doing really well on it currently.  We did discuss possibly going up to 2 mg in the next couple of months  Follow-up and referrals: 8weeks

## 2022-12-26 NOTE — Assessment & Plan Note (Signed)
BP looks great. If anything it is on the lower side.  Monitor at home.

## 2022-12-26 NOTE — Patient Instructions (Signed)
Monitor BP 

## 2022-12-26 NOTE — Progress Notes (Signed)
   Established Patient Office Visit  Subjective   Patient ID: Amanda Moyer, female    DOB: 1950-07-13  Age: 72 y.o. MRN: [id number]  Chief Complaint  Patient presents with   Medical Management of Chronic Issues    Wgt managemnt and bp check    HPI  Follow-up weight management-currently on Wegovy 1.7 mg and doing well. Currently on 1.7mg  Wegovy.    Hypertension- Pt denies chest pain, SOB, dizziness, or heart palpitations.  Taking meds as directed w/o problems.  Denies medication side effects.    Check ear today. They getting full at time.Marland Kitchen no pain or fever.     ROS    Objective:     BP 113/87   Pulse 85   Ht 5\' 5"  (1.651 m)   Wt 142 lb (64.4 kg)   SpO2 99%   BMI 23.63 kg/m    Physical Exam Vitals and nursing note reviewed.  Constitutional:      Appearance: Normal appearance.  HENT:     Head: Normocephalic and atraumatic.     Right Ear: Tympanic membrane, ear canal and external ear normal.     Left Ear: Tympanic membrane, ear canal and external ear normal.  Eyes:     Conjunctiva/sclera: Conjunctivae normal.  Cardiovascular:     Rate and Rhythm: Normal rate and regular rhythm.  Pulmonary:     Effort: Pulmonary effort is normal.     Breath sounds: Normal breath sounds.  Musculoskeletal:     Cervical back: Neck supple.  Skin:    General: Skin is warm and dry.  Neurological:     Mental Status: She is alert.  Psychiatric:        Mood and Affect: Mood normal.      No results found for any visits on 12/26/22.    The 10-year ASCVD risk score (Arnett DK, et al., 2019) is: 7.3%    Assessment & Plan:   Problem List Items Addressed This Visit       Cardiovascular and Mediastinum   Essential hypertension    BP looks great. If anything it is on the lower side.  Monitor at home.        Other   Encounter for weight management - Primary    Visit #: 5 Starting Weight: 163 lbs   Current weight: 142 lbs  Previous weight: 151 lbs  Change in weight:  down 9 lbs  Goal weight:135-139 lbs  Dietary goals: work on more veggie and  decreasing carbs  Exercise goals: Walking 5 to 6 days/week. Medication: continue with Wegovy 1.7 mg as she is doing really well on it currently.  We did discuss possibly going up to 2 mg in the next couple of months  Follow-up and referrals: 8weeks         Other Visit Diagnoses     Dysfunction of both eustachian tubes          Eustachian tube dysfunction - canals and ems are clear.  Gave reassurance.  No concerns or problems.   Return in about 8 months (around 08/26/2023) for weight mgt.    Nani Gasser, MD

## 2022-12-27 MED ORDER — WEGOVY 1.7 MG/0.75ML ~~LOC~~ SOAJ: 1.7000 mg | SUBCUTANEOUS | 1 refills | Status: DC

## 2023-01-29 DIAGNOSIS — M2011 Hallux valgus (acquired), right foot: Secondary | ICD-10-CM | POA: Diagnosis not present

## 2023-01-29 DIAGNOSIS — M79671 Pain in right foot: Secondary | ICD-10-CM | POA: Diagnosis not present

## 2023-01-29 DIAGNOSIS — M2021 Hallux rigidus, right foot: Secondary | ICD-10-CM | POA: Diagnosis not present

## 2023-02-04 ENCOUNTER — Encounter: Payer: Self-pay | Admitting: Family Medicine

## 2023-02-04 DIAGNOSIS — Z7689 Persons encountering health services in other specified circumstances: Secondary | ICD-10-CM

## 2023-02-04 MED ORDER — WEGOVY 2.4 MG/0.75ML ~~LOC~~ SOAJ
2.4000 mg | SUBCUTANEOUS | 2 refills | Status: DC
Start: 2023-02-04 — End: 2023-10-14

## 2023-03-31 ENCOUNTER — Ambulatory Visit: Payer: BLUE CROSS/BLUE SHIELD | Admitting: Family Medicine

## 2023-06-23 ENCOUNTER — Other Ambulatory Visit: Payer: Self-pay | Admitting: Family Medicine

## 2023-06-23 DIAGNOSIS — I10 Essential (primary) hypertension: Secondary | ICD-10-CM

## 2023-06-25 ENCOUNTER — Encounter: Payer: Self-pay | Admitting: Family Medicine

## 2023-08-26 ENCOUNTER — Ambulatory Visit: Payer: BLUE CROSS/BLUE SHIELD | Admitting: Family Medicine

## 2023-08-28 DIAGNOSIS — Z1231 Encounter for screening mammogram for malignant neoplasm of breast: Secondary | ICD-10-CM | POA: Diagnosis not present

## 2023-08-28 LAB — HM MAMMOGRAPHY

## 2023-09-03 ENCOUNTER — Ambulatory Visit: Admitting: Family Medicine

## 2023-10-14 ENCOUNTER — Ambulatory Visit (INDEPENDENT_AMBULATORY_CARE_PROVIDER_SITE_OTHER): Admitting: Family Medicine

## 2023-10-14 ENCOUNTER — Encounter: Payer: Self-pay | Admitting: Family Medicine

## 2023-10-14 VITALS — BP 126/66 | HR 68 | Ht 65.0 in | Wt 146.4 lb

## 2023-10-14 DIAGNOSIS — K5900 Constipation, unspecified: Secondary | ICD-10-CM | POA: Diagnosis not present

## 2023-10-14 DIAGNOSIS — N814 Uterovaginal prolapse, unspecified: Secondary | ICD-10-CM | POA: Diagnosis not present

## 2023-10-14 DIAGNOSIS — I10 Essential (primary) hypertension: Secondary | ICD-10-CM | POA: Diagnosis not present

## 2023-10-14 MED ORDER — LINACLOTIDE 290 MCG PO CAPS: 290.0000 ug | ORAL_CAPSULE | Freq: Every day | ORAL | 1 refills | Status: AC

## 2023-10-14 NOTE — Assessment & Plan Note (Addendum)
 Ok to use miralax 1-2 x per day as needed.  Also will go ahead and refill Linzess  as well.  To work on hydration and fiber.

## 2023-10-14 NOTE — Assessment & Plan Note (Addendum)
 Well controlled. Continue current regimen. Follow up in  19mo due for labs

## 2023-10-14 NOTE — Progress Notes (Signed)
   Established Patient Office Visit  Subjective  Patient ID: Amanda Moyer, female    DOB: 07-27-1950  Age: 73 y.o. MRN: [id number]  Chief Complaint  Patient presents with   Weight Check    HPI  Off Wegovy  since December and doing well. She had gotten down to 135 lbs and has gained some weight back.  She still has some 2.4 Wegovy  in her fridge and want to know if she would be able to take it.  Hypertension- Pt denies chest pain, SOB, dizziness, or heart palpitations.  Taking meds as directed w/o problems.  Denies medication side effects.    She also feels like she is getting a little bit of uterine prolapse.  She is noticing that she is having to push something into the vaginal area.  She has a physician that a couple of friends have recommended and would like to do a consultation with him in that regard.  Has issues on and off with constipation and especially since being on the Wegovy .  She usually uses Linzess  but wanted to know if would be okay to use the MiraLAX.    ROS    Objective:     BP 126/66 (BP Location: Left Arm, Cuff Size: Normal)   Pulse 68   Ht 5' 5 (1.651 m)   Wt 146 lb 6.4 oz (66.4 kg)   SpO2 100%   BMI 24.36 kg/m    Physical Exam Vitals and nursing note reviewed.  Constitutional:      Appearance: Normal appearance.  HENT:     Head: Normocephalic and atraumatic.  Eyes:     Conjunctiva/sclera: Conjunctivae normal.  Cardiovascular:     Rate and Rhythm: Normal rate and regular rhythm.  Pulmonary:     Effort: Pulmonary effort is normal.     Breath sounds: Normal breath sounds.  Skin:    General: Skin is warm and dry.  Neurological:     Mental Status: She is alert.  Psychiatric:        Mood and Affect: Mood normal.      No results found for any visits on 10/14/23.    The 10-year ASCVD risk score (Arnett DK, et al., 2019) is: 10.1%    Assessment & Plan:   Problem List Items Addressed This Visit       Cardiovascular and Mediastinum    Essential hypertension - Primary   Well controlled. Continue current regimen. Follow up in  6 mo. due for labs.       Relevant Orders   CMP14+EGFR   Lipid panel   CBC     Other   Constipation   Ok to use miralax 1-2 x per day as needed.  Also will go ahead and refill Linzess  as well.  To work on hydration and fiber.      Other Visit Diagnoses       Uterine prolapse       Relevant Orders   Ambulatory referral to Gynecology      Uterine prolapse-will refer to Dr. Edwena.  If she has any problems or concerns then please let me know.  Return in about 6 months (around 04/15/2024) for Hypertension.    Dorothyann Byars, MD

## 2023-10-15 ENCOUNTER — Ambulatory Visit: Payer: Self-pay | Admitting: Family Medicine

## 2023-10-15 LAB — CMP14+EGFR
ALT: 12 IU/L (ref 0–32)
AST: 18 IU/L (ref 0–40)
Albumin: 5 g/dL — ABNORMAL HIGH (ref 3.8–4.8)
Alkaline Phosphatase: 64 IU/L (ref 44–121)
BUN/Creatinine Ratio: 11 — ABNORMAL LOW (ref 12–28)
BUN: 9 mg/dL (ref 8–27)
Bilirubin Total: 0.8 mg/dL (ref 0.0–1.2)
CO2: 22 mmol/L (ref 20–29)
Calcium: 10.4 mg/dL — ABNORMAL HIGH (ref 8.7–10.3)
Chloride: 95 mmol/L — ABNORMAL LOW (ref 96–106)
Creatinine, Ser: 0.82 mg/dL (ref 0.57–1.00)
Globulin, Total: 2.6 g/dL (ref 1.5–4.5)
Glucose: 98 mg/dL (ref 70–99)
Potassium: 4 mmol/L (ref 3.5–5.2)
Sodium: 136 mmol/L (ref 134–144)
Total Protein: 7.6 g/dL (ref 6.0–8.5)
eGFR: 76 mL/min/1.73 (ref >59)

## 2023-10-15 LAB — LIPID PANEL
Chol/HDL Ratio: 2.5 ratio (ref 0.0–4.4)
Cholesterol, Total: 232 mg/dL — ABNORMAL HIGH (ref 100–199)
HDL: 92 mg/dL (ref >39)
LDL Chol Calc (NIH): 125 mg/dL — ABNORMAL HIGH (ref 0–99)
Triglycerides: 89 mg/dL (ref 0–149)
VLDL Cholesterol Cal: 15 mg/dL (ref 5–40)

## 2023-10-15 LAB — CBC
Hematocrit: 45.9 % (ref 34.0–46.6)
Hemoglobin: 14.9 g/dL (ref 11.1–15.9)
MCH: 30.1 pg (ref 26.6–33.0)
MCHC: 32.5 g/dL (ref 31.5–35.7)
MCV: 93 fL (ref 79–97)
Platelets: 299 x10E3/uL (ref 150–450)
RBC: 4.95 x10E6/uL (ref 3.77–5.28)
RDW: 12.3 % (ref 11.7–15.4)
WBC: 6.4 x10E3/uL (ref 3.4–10.8)

## 2023-10-15 NOTE — Progress Notes (Signed)
 Hi Adamae, your sodium level is back in the normal range which is great.  Calcium level is close to normal.  Liver enzymes look good.  Total cholesterol and LDL are mildly elevated continue to work on healthy Mediterranean diet as well as regular exercise.  Blood count is normal.

## 2023-12-17 ENCOUNTER — Other Ambulatory Visit: Payer: Self-pay | Admitting: Family Medicine

## 2023-12-17 DIAGNOSIS — I10 Essential (primary) hypertension: Secondary | ICD-10-CM

## 2024-01-01 DIAGNOSIS — H31092 Other chorioretinal scars, left eye: Secondary | ICD-10-CM | POA: Diagnosis not present

## 2024-01-01 DIAGNOSIS — H209 Unspecified iridocyclitis: Secondary | ICD-10-CM | POA: Diagnosis not present

## 2024-01-01 DIAGNOSIS — H25813 Combined forms of age-related cataract, bilateral: Secondary | ICD-10-CM | POA: Diagnosis not present

## 2024-01-01 DIAGNOSIS — H527 Unspecified disorder of refraction: Secondary | ICD-10-CM | POA: Diagnosis not present

## 2024-01-01 DIAGNOSIS — H40013 Open angle with borderline findings, low risk, bilateral: Secondary | ICD-10-CM | POA: Diagnosis not present

## 2024-04-15 ENCOUNTER — Ambulatory Visit: Admitting: Family Medicine
